# Patient Record
Sex: Female | Born: 1945 | Race: White | Hispanic: No | Smoking: Never smoker
Health system: Southern US, Community
[De-identification: ages and names within clinical notes are randomized; demographics above are authoritative.]

## PROBLEM LIST (undated history)

## (undated) DIAGNOSIS — I219 Acute myocardial infarction, unspecified: Secondary | ICD-10-CM

## (undated) DIAGNOSIS — I10 Essential (primary) hypertension: Secondary | ICD-10-CM

## (undated) DIAGNOSIS — E7800 Pure hypercholesterolemia, unspecified: Secondary | ICD-10-CM

## (undated) DIAGNOSIS — N289 Disorder of kidney and ureter, unspecified: Secondary | ICD-10-CM

## (undated) HISTORY — PX: CORONARY STENT INTERVENTION: CATH118234

## (undated) HISTORY — PX: HIP SURGERY: SHX245

## (undated) HISTORY — PX: PARATHYROIDECTOMY: SHX19

---

## 1999-08-26 ENCOUNTER — Other Ambulatory Visit: Admission: RE | Admit: 1999-08-26 | Discharge: 1999-08-26 | Payer: Self-pay | Admitting: Family Medicine

## 2020-03-13 ENCOUNTER — Encounter (HOSPITAL_BASED_OUTPATIENT_CLINIC_OR_DEPARTMENT_OTHER): Payer: Self-pay

## 2020-03-13 ENCOUNTER — Emergency Department (HOSPITAL_BASED_OUTPATIENT_CLINIC_OR_DEPARTMENT_OTHER): Payer: Medicare Other

## 2020-03-13 ENCOUNTER — Emergency Department (HOSPITAL_BASED_OUTPATIENT_CLINIC_OR_DEPARTMENT_OTHER)
Admission: EM | Admit: 2020-03-13 | Discharge: 2020-03-13 | Disposition: A | Discharge status: Home / Self Care | Payer: Medicare Other | Attending: Emergency Medicine | Admitting: Emergency Medicine

## 2020-03-13 ENCOUNTER — Other Ambulatory Visit: Payer: Self-pay

## 2020-03-13 DIAGNOSIS — M79661 Pain in right lower leg: Secondary | ICD-10-CM | POA: Diagnosis present

## 2020-03-13 DIAGNOSIS — I2510 Atherosclerotic heart disease of native coronary artery without angina pectoris: Secondary | ICD-10-CM | POA: Insufficient documentation

## 2020-03-13 DIAGNOSIS — S7001XA Contusion of right hip, initial encounter: Secondary | ICD-10-CM

## 2020-03-13 DIAGNOSIS — W010XXA Fall on same level from slipping, tripping and stumbling without subsequent striking against object, initial encounter: Secondary | ICD-10-CM | POA: Insufficient documentation

## 2020-03-13 DIAGNOSIS — S8391XA Sprain of unspecified site of right knee, initial encounter: Secondary | ICD-10-CM | POA: Diagnosis not present

## 2020-03-13 DIAGNOSIS — Y999 Unspecified external cause status: Secondary | ICD-10-CM | POA: Diagnosis not present

## 2020-03-13 DIAGNOSIS — Y9389 Activity, other specified: Secondary | ICD-10-CM | POA: Insufficient documentation

## 2020-03-13 DIAGNOSIS — Y9289 Other specified places as the place of occurrence of the external cause: Secondary | ICD-10-CM | POA: Insufficient documentation

## 2020-03-13 DIAGNOSIS — W19XXXA Unspecified fall, initial encounter: Secondary | ICD-10-CM

## 2020-03-13 HISTORY — DX: Pure hypercholesterolemia, unspecified: E78.00

## 2020-03-13 HISTORY — DX: Essential (primary) hypertension: I10

## 2020-03-13 HISTORY — DX: Disorder of kidney and ureter, unspecified: N28.9

## 2020-03-13 HISTORY — DX: Acute myocardial infarction, unspecified: I21.9

## 2020-03-13 MED ORDER — TRAMADOL HCL 50 MG PO TABS: 50.0000 mg | ORAL_TABLET | Freq: Four times a day (QID) | ORAL | 0 refills | Status: DC | PRN

## 2020-03-13 NOTE — Discharge Instructions (Addendum)
Rest.  Take tramadol as prescribed as needed for pain.  Follow-up with your primary doctor or orthopedist in the next week if symptoms are not improving.

## 2020-03-13 NOTE — ED Provider Notes (Signed)
[facility] EMERGENCY DEPARTMENT Provider Note   CSN: [id number] Arrival date & time: 03/13/20  1136     History Chief Complaint  Patient presents with  . Fall    Tamara Bowen is a 74 y.o. female.  Patient is a 74 year old female with past medical history of hypertension, chronic renal insufficiency, coronary artery disease, and prior right hip fracture.  She presents today for evaluation of fall.  Patient was walking in the hall yesterday and slipped on a wet floor.  She landed on her right hip.  She has been having pain in the hip and knee since.  It is worse when she twists her knee and ambulates.  She denies other injury.  She denies having struck her head or any chest, abdominal, or back pain.  The history is provided by the patient.  Fall This is a new problem. The current episode started yesterday. The problem occurs constantly. The problem has not changed since onset.Exacerbated by: Movement and bearing weight. The symptoms are relieved by rest. She has tried nothing for the symptoms.       Past Medical History:  Diagnosis Date  . Heart attack ([place])   . High cholesterol   . Hypertension   . Renal disorder     There are no problems to display for this patient.   Past Surgical History:  Procedure Laterality Date  . CORONARY STENT INTERVENTION    . HIP SURGERY    . PARATHYROIDECTOMY       OB History   No obstetric history on file.     No family history on file.  Social History   Tobacco Use  . Smoking status: Never Smoker  . Smokeless tobacco: Never Used  Substance Use Topics  . Alcohol use: Never  . Drug use: Never    Home Medications Prior to Admission medications   Not on File    Allergies    Patient has no known allergies.  Review of Systems   Review of Systems  All other systems reviewed and are negative.   Physical Exam Updated Vital Signs BP 132/66 (BP Location: Left Arm)   Pulse 74   Temp 98.9 F (37.2 C) (Oral)    Resp 18   SpO2 98%   Physical Exam Vitals and nursing note reviewed.  Constitutional:      General: She is not in acute distress.    Appearance: Normal appearance. She is not ill-appearing.  HENT:     Head: Normocephalic and atraumatic.  Pulmonary:     Effort: Pulmonary effort is normal.  Musculoskeletal:     Comments: The right hip appears grossly normal.  There is no obvious deformity.  She does have pain with range of motion.  The right knee appears grossly normal without deformity or effusion.  She does have some pain with range of motion, but the knee joint appears stable to AP and varus and valgus stress.  DP pulses are easily palpable and motor and sensation are intact throughout the entire foot.  Skin:    General: Skin is warm and dry.  Neurological:     Mental Status: She is alert and oriented to person, place, and time.     ED Results / Procedures / Treatments   Labs (all labs ordered are listed, but only abnormal results are displayed) Labs Reviewed - No data to display  EKG None  Radiology No results found.  Procedures Procedures (including critical care time)  Medications Ordered in  ED Medications - No data to display  ED Course  I have reviewed the triage vital signs and the nursing notes.  Pertinent labs & imaging results that were available during my care of the patient were reviewed by me and considered in my medical decision making (see chart for details).    MDM Rules/Calculators/A&P  Patient is a 74 year old female with prior right total hip replacement.  She presents complaining of pain in her right knee and hip after slipping on a wet floor and falling yesterday.  The right leg appears grossly normal and is neurovascularly intact.  The hip and knee joint appears stable and x-rays are negative for fracture/dislocation.  The screws in the right femoral neck appear in proper position.  At this point, I feel as though discharge is appropriate.   Patient to be given medicine for pain and is to follow-up with her orthopedist if not improving in the next week.  Final Clinical Impression(s) / ED Diagnoses Final diagnoses:  None    Rx / DC Orders ED Discharge Orders    None       Geoffery Lyons, MD 03/13/20 1345

## 2020-03-13 NOTE — ED Triage Notes (Addendum)
Pt states she slipped in water/ fell yesterday-pain to right LE and right hip-NAD-limping gait

## 2020-10-28 ENCOUNTER — Other Ambulatory Visit: Payer: Self-pay

## 2020-10-28 ENCOUNTER — Encounter (HOSPITAL_BASED_OUTPATIENT_CLINIC_OR_DEPARTMENT_OTHER): Payer: Self-pay | Admitting: Emergency Medicine

## 2020-10-28 DIAGNOSIS — R0789 Other chest pain: Secondary | ICD-10-CM | POA: Insufficient documentation

## 2020-10-28 DIAGNOSIS — I10 Essential (primary) hypertension: Secondary | ICD-10-CM | POA: Insufficient documentation

## 2020-10-28 DIAGNOSIS — Z955 Presence of coronary angioplasty implant and graft: Secondary | ICD-10-CM | POA: Diagnosis not present

## 2020-10-28 DIAGNOSIS — R531 Weakness: Secondary | ICD-10-CM | POA: Diagnosis present

## 2020-10-28 DIAGNOSIS — I2510 Atherosclerotic heart disease of native coronary artery without angina pectoris: Secondary | ICD-10-CM | POA: Insufficient documentation

## 2020-10-28 DIAGNOSIS — Z7982 Long term (current) use of aspirin: Secondary | ICD-10-CM | POA: Insufficient documentation

## 2020-10-28 DIAGNOSIS — Z79899 Other long term (current) drug therapy: Secondary | ICD-10-CM | POA: Diagnosis not present

## 2020-10-28 LAB — BASIC METABOLIC PANEL
Anion gap: 11 (ref 5–15)
BUN: 17 mg/dL (ref 8–23)
CO2: 21 mmol/L — ABNORMAL LOW (ref 22–32)
Calcium: 9.4 mg/dL (ref 8.9–10.3)
Chloride: 99 mmol/L (ref 98–111)
Creatinine, Ser: 1.06 mg/dL — ABNORMAL HIGH (ref 0.44–1.00)
GFR, Estimated: 55 mL/min — ABNORMAL LOW (ref >60)
Glucose, Bld: 104 mg/dL — ABNORMAL HIGH (ref 70–99)
Potassium: 3.8 mmol/L (ref 3.5–5.1)
Sodium: 131 mmol/L — ABNORMAL LOW (ref 135–145)

## 2020-10-28 LAB — CBC
HCT: 37.5 % (ref 36.0–46.0)
Hemoglobin: 12.8 g/dL (ref 12.0–15.0)
MCH: 30.5 pg (ref 26.0–34.0)
MCHC: 34.1 g/dL (ref 30.0–36.0)
MCV: 89.3 fL (ref 80.0–100.0)
Platelets: 214 10*3/uL (ref 150–400)
RBC: 4.2 MIL/uL (ref 3.87–5.11)
RDW: 14.4 % (ref 11.5–15.5)
WBC: 9.6 10*3/uL (ref 4.0–10.5)
nRBC: 0 % (ref 0.0–0.2)

## 2020-10-28 LAB — TROPONIN I (HIGH SENSITIVITY): Troponin I (High Sensitivity): 9 ng/L (ref <18)

## 2020-10-28 NOTE — ED Triage Notes (Signed)
Pt having weakness in both arms and legs since this am.  Pt states she had a burning sensation in her chest.  Went to [facility] via ambulance and had EKG, CXR performed.  Her granddaughter took her from there to here.  Currently pt denies any chest burning but feels like her arms are weak/tingling.  No neuro deficits noted.  Grips equal, no numbness in extremeties.  Pt admits to GERD

## 2020-10-29 ENCOUNTER — Emergency Department (HOSPITAL_BASED_OUTPATIENT_CLINIC_OR_DEPARTMENT_OTHER)
Admission: EM | Admit: 2020-10-29 | Discharge: 2020-10-29 | Disposition: A | Discharge status: Home / Self Care | Payer: Medicare Other | Attending: Emergency Medicine | Admitting: Emergency Medicine

## 2020-10-29 DIAGNOSIS — R0789 Other chest pain: Secondary | ICD-10-CM

## 2020-10-29 DIAGNOSIS — R531 Weakness: Secondary | ICD-10-CM | POA: Diagnosis not present

## 2020-10-29 LAB — TROPONIN I (HIGH SENSITIVITY): Troponin I (High Sensitivity): 11 ng/L (ref <18)

## 2020-10-29 NOTE — ED Notes (Signed)
Patient verbalizes understanding of discharge instructions. Opportunity for questioning and answers were provided. Armband removed by staff, pt discharged from ED ambulatory to home.  

## 2020-10-29 NOTE — ED Provider Notes (Signed)
[facility] EMERGENCY DEPARTMENT Provider Note   CSN: [id number] Arrival date & time: 10/28/20  1643     History Chief Complaint  Patient presents with  . Weakness    Tamara Bowen is a 75 y.o. female.  Patient is a 75 year old female with history of coronary artery disease with prior stents, hypertension, hyperlipidemia.  Patient presents today for evaluation of burning in the center of her chest along with numbness in her arms and legs.  This came on this morning while eating breakfast.  Patient initially went to [facility] by ambulance.  She was taken to the waiting room, then brought here by family for evaluation.  Patient tells me she took 3 nitroglycerin at home with some relief, however her symptoms returned.  She denies shortness of breath, nausea, diaphoresis.  She denies recent exertional symptoms.  She sees a cardiologist here at [facility] who placed the stents approximately 5 years ago.  The history is provided by the patient.  Weakness Severity:  Moderate Onset quality:  Sudden Duration:  12 hours Timing:  Constant Progression:  Unchanged Chronicity:  New Relieved by:  Nothing Worsened by:  Nothing Ineffective treatments:  None tried Associated symptoms: chest pain        Past Medical History:  Diagnosis Date  . Heart attack ([facility])   . High cholesterol   . Hypertension   . Renal disorder     There are no problems to display for this patient.   Past Surgical History:  Procedure Laterality Date  . CORONARY STENT INTERVENTION    . HIP SURGERY    . PARATHYROIDECTOMY       OB History   No obstetric history on file.     No family history on file.  Social History   Tobacco Use  . Smoking status: Never Smoker  . Smokeless tobacco: Never Used  Vaping Use  . Vaping Use: Never used  Substance Use Topics  . Alcohol use: Never  . Drug use: Never    Home Medications Prior to Admission medications   Medication Sig Start Date  End Date Taking? Authorizing Provider  amLODipine (NORVASC) 5 MG tablet Take 5 mg by mouth daily. 02/29/20   [provider]  aspirin 81 MG EC tablet Take by mouth.    [provider]  atorvastatin (LIPITOR) 40 MG tablet Take 40 mg by mouth daily. 01/16/20   [provider]  carvedilol (COREG) 25 MG tablet Take 25 mg by mouth 2 (two) times daily. 01/26/20   [provider]  Cholecalciferol 25 MCG (1000 UT) capsule Take by mouth.    [provider]  clopidogrel (PLAVIX) 75 MG tablet Take 75 mg by mouth daily. 01/26/20   [provider]  famotidine (PEPCID) 20 MG tablet TAKE 2 TABLETS BY MOUTH EVERY EVENING 02/22/20   [provider]  hydrALAZINE (APRESOLINE) 25 MG tablet Take 25 mg by mouth 3 (three) times daily. 12/21/19   [provider]  isosorbide mononitrate (IMDUR) 120 MG 24 hr tablet Take 120 mg by mouth daily. 01/26/20   [provider]  levothyroxine (SYNTHROID) 88 MCG tablet Take 88 mcg by mouth daily. 12/21/19   [provider]  traMADol (ULTRAM) 50 MG tablet Take 1 tablet (50 mg total) by mouth every 6 (six) hours as needed. 03/13/20   Geoffery Lyons, MD    Allergies    Patient has no known allergies.  Review of Systems   Review of Systems  Cardiovascular: Positive for chest pain.  Neurological: Positive for weakness.  All other systems reviewed and are negative.   Physical Exam Updated Vital Signs BP (!) 175/73 (BP Location: Right Arm)   Pulse 73   Temp 98.6 F (37 C) (Oral)   Resp 19   Ht 5\' 2"  (1.575 m)   Wt 55.8 kg   SpO2 97%   BMI 22.50 kg/m   Physical Exam Vitals and nursing note reviewed.  Constitutional:      General: She is not in acute distress.    Appearance: She is well-developed and well-nourished. She is not diaphoretic.  HENT:     Head: Normocephalic and atraumatic.  Cardiovascular:     Rate and Rhythm: Normal rate and regular rhythm.     Heart sounds: No murmur  heard. No friction rub. No gallop.   Pulmonary:     Effort: Pulmonary effort is normal. No respiratory distress.     Breath sounds: Normal breath sounds. No wheezing.  Abdominal:     General: Bowel sounds are normal. There is no distension.     Palpations: Abdomen is soft.     Tenderness: There is no abdominal tenderness.  Musculoskeletal:        General: No swelling or tenderness. Normal range of motion.     Cervical back: Normal range of motion and neck supple.     Right lower leg: No edema.     Left lower leg: No edema.  Skin:    General: Skin is warm and dry.  Neurological:     Mental Status: She is alert and oriented to person, place, and time.     ED Results / Procedures / Treatments   Labs (all labs ordered are listed, but only abnormal results are displayed) Labs Reviewed  BASIC METABOLIC PANEL - Abnormal; Notable for the following components:      Result Value   Sodium 131 (*)    CO2 21 (*)    Glucose, Bld 104 (*)    Creatinine, Ser 1.06 (*)    GFR, Estimated 55 (*)    All other components within normal limits  CBC  TROPONIN I (HIGH SENSITIVITY)  TROPONIN I (HIGH SENSITIVITY)    EKG EKG Interpretation  Date/Time:  Monday October 28 2020 17:52:21 EST Ventricular Rate:  80 PR Interval:  208 QRS Duration: 72 QT Interval:  396 QTC Calculation: 456 R Axis:   63 Text Interpretation: Normal sinus rhythm Nonspecific ST and T wave abnormality Abnormal ECG Confirmed by [id number] (Geoffery Lyons) on 10/29/2020 1:12:52 AM   Radiology No results found.  Procedures Procedures   Medications Ordered in ED Medications - No data to display  ED Course  I have reviewed the triage vital signs and the nursing notes.  Pertinent labs & imaging results that were available during my care of the patient were reviewed by me and considered in my medical decision making (see chart for details).    MDM Rules/Calculators/A&P  Patient presenting here with complaints of weakness  in her arms and a burning sensation in her chest.  She has history of coronary artery disease, but this is not consistent with prior cardiac symptoms.  Patient's work-up shows an unchanged EKG and negative troponin x2.  She tells me she is feeling better.  At this point, I feel as though discharge is appropriate.  I will advise her to follow-up with her cardiologist later this week, and return to the ER if her symptoms significantly worsen or change.  Final Clinical Impression(s) / ED Diagnoses Final diagnoses:  None    Rx / DC Orders ED Discharge Orders    None       Geoffery Lyons, MD 10/29/20 [phone]

## 2020-10-29 NOTE — Discharge Instructions (Addendum)
Continue medications as previously prescribed.  Follow-up with your cardiologist later this week if symptoms do not improve, and return to the ER if you develop severe chest pain, difficulty breathing, or other new and concerning symptoms.

## 2021-12-01 IMAGING — CR DG FEMUR 2+V*R*
4 series · 4 of 4 positions shown · non-contrast
Comparison: Pelvis and right hip radiographs March 13, 2020

CLINICAL DATA: Pain following fall

EXAM:
RIGHT FEMUR 2 VIEWS

[t hip ap right]
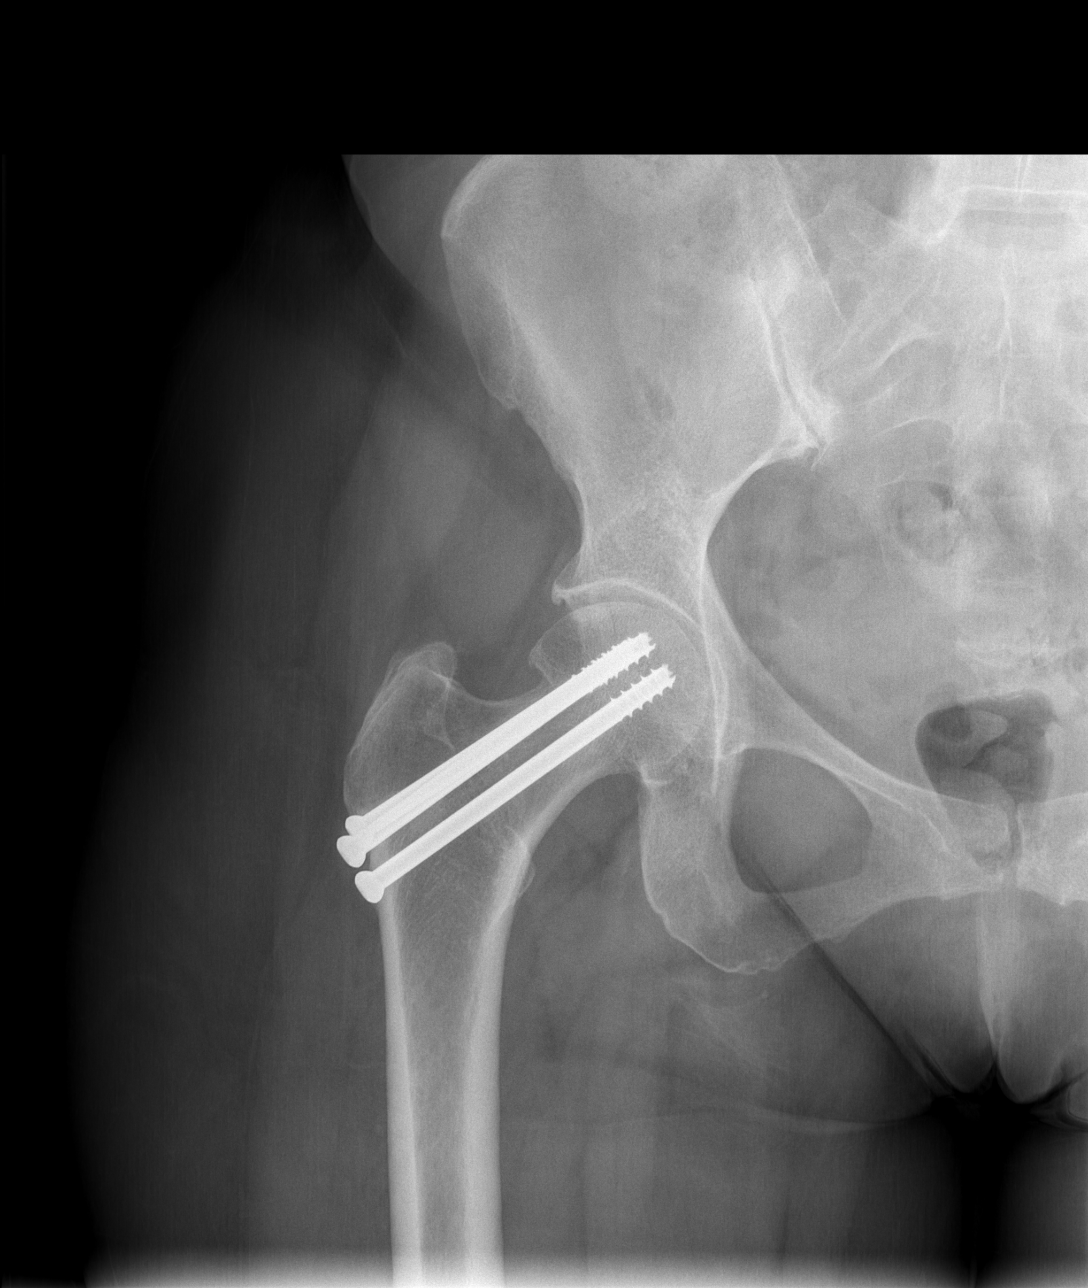

[t femur with knee ap right]
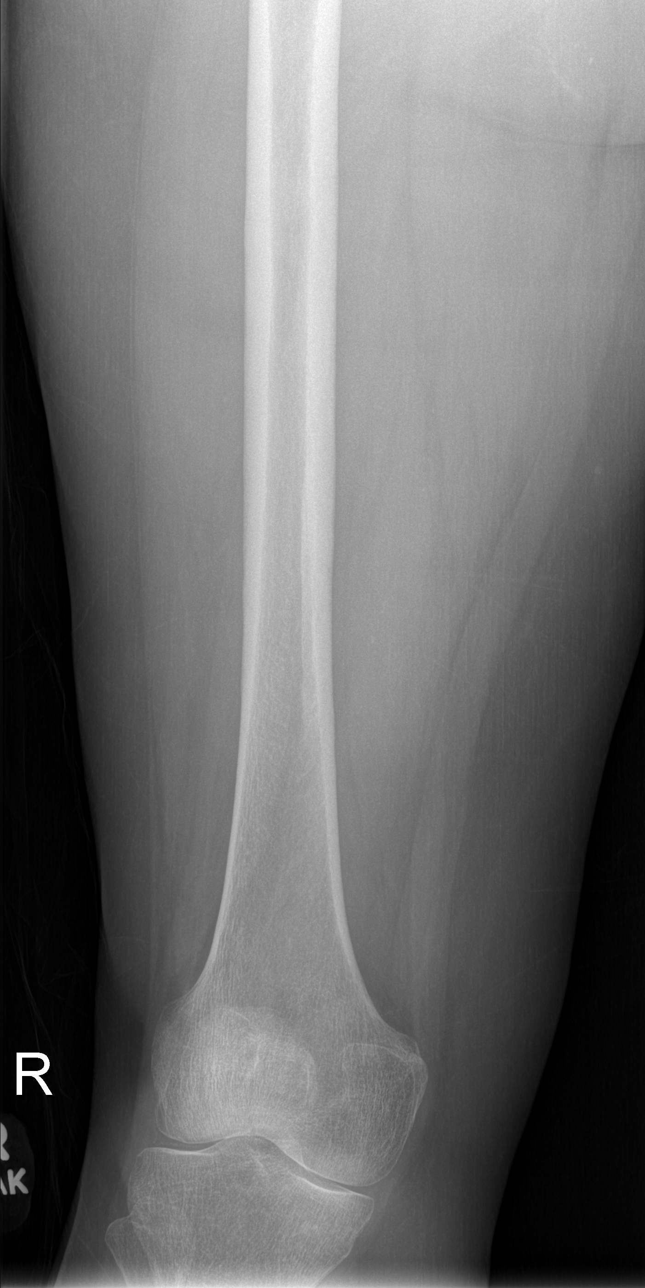

[t hip frog leg right]
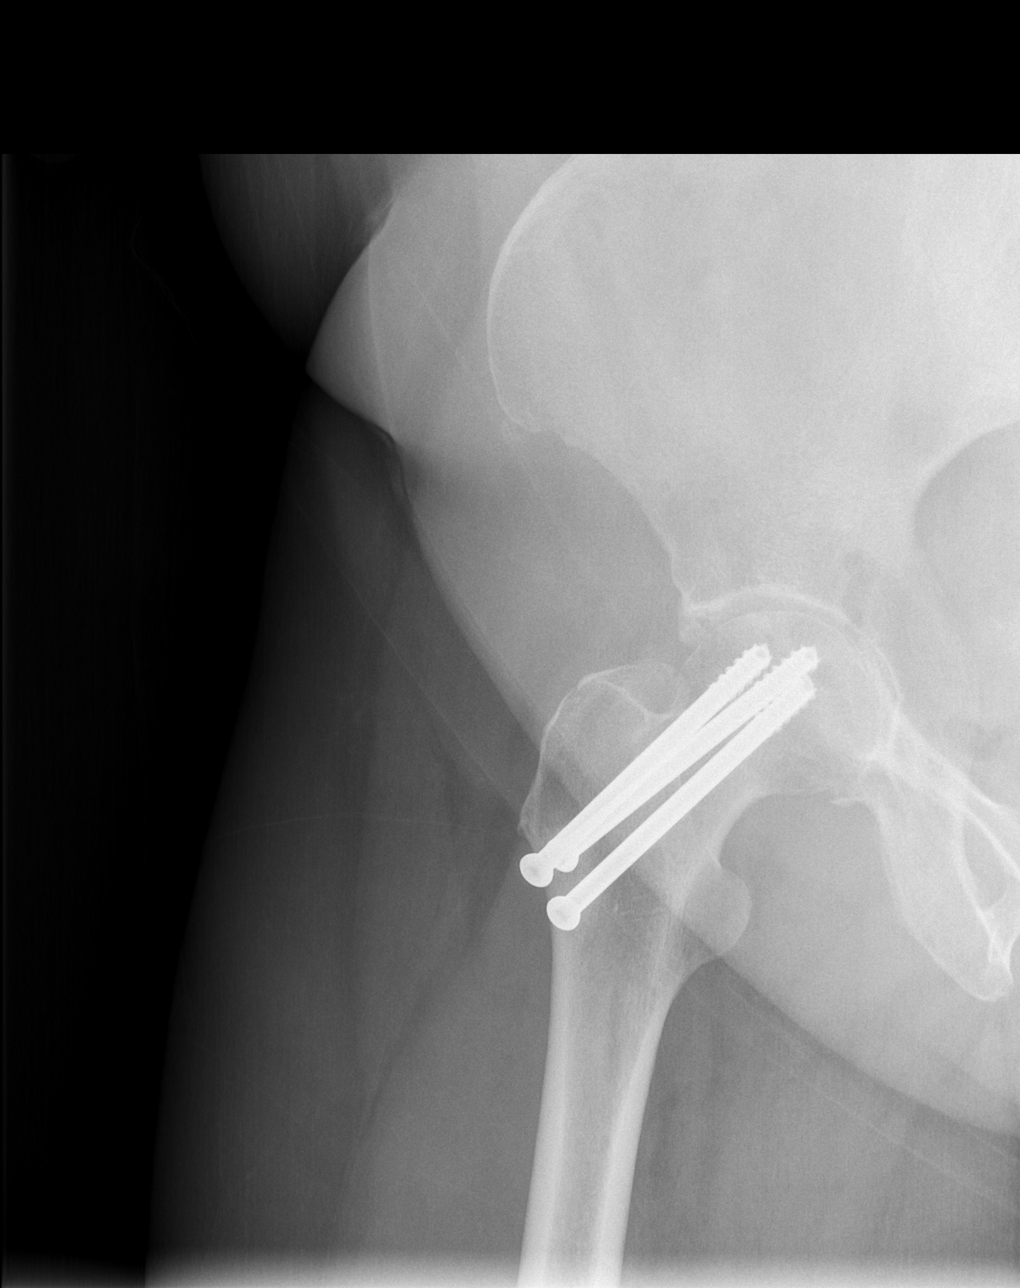

[t femur with hip lat right]
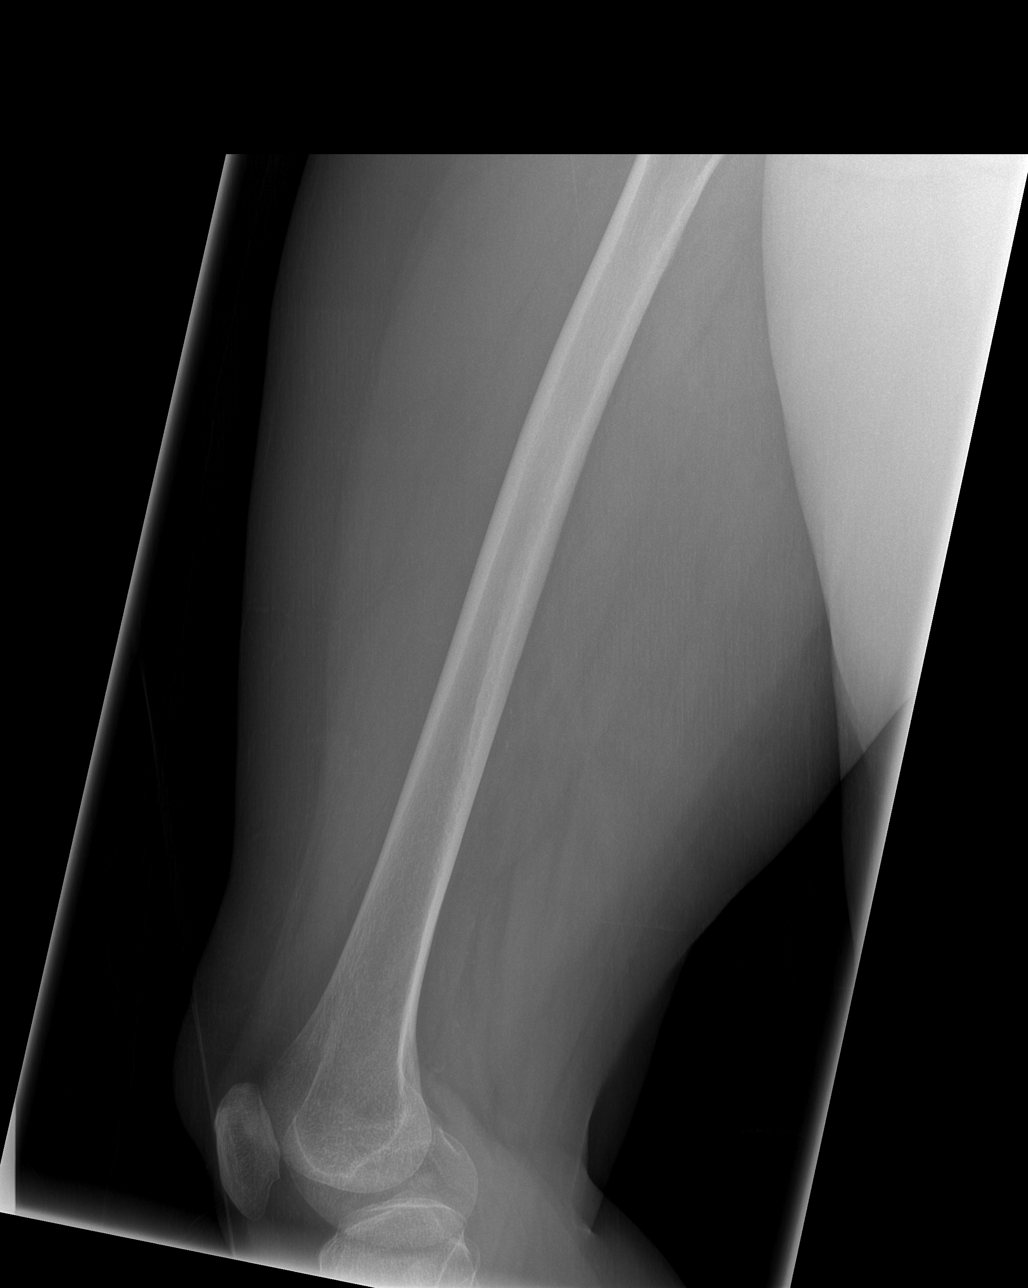

[4 of 4 positions shown; findings below may reference images not displayed]

FINDINGS: Frontal and lateral views obtained. Surgical screws through the
femoral neck region on the right are present with screw tips in the
right femoral head. Alignment in this area is anatomic. No acute
fracture or dislocation. No abnormal periosteal reaction. There is
moderate narrowing of the right hip joint. There is mild narrowing
of the medial and lateral compartments of the right knee were slight
chondrocalcinosis in the right knee region.
IMPRESSION: Postoperative change proximally. No acute fracture or dislocation.
Osteoarthritic change in the right hip and knee regions.
Chondrocalcinosis in the right knee joint may be seen with
osteoarthritis or with calcium pyrophosphate deposition disease.

## 2021-12-01 IMAGING — CR DG HIP (WITH OR WITHOUT PELVIS) 2-3V*R*
3 series · 3 of 3 positions shown · non-contrast
Comparison: None.

CLINICAL DATA: Pain following

EXAM:
DG HIP (WITH OR WITHOUT PELVIS) 2-3V RIGHT

[t pelvis a.p.]
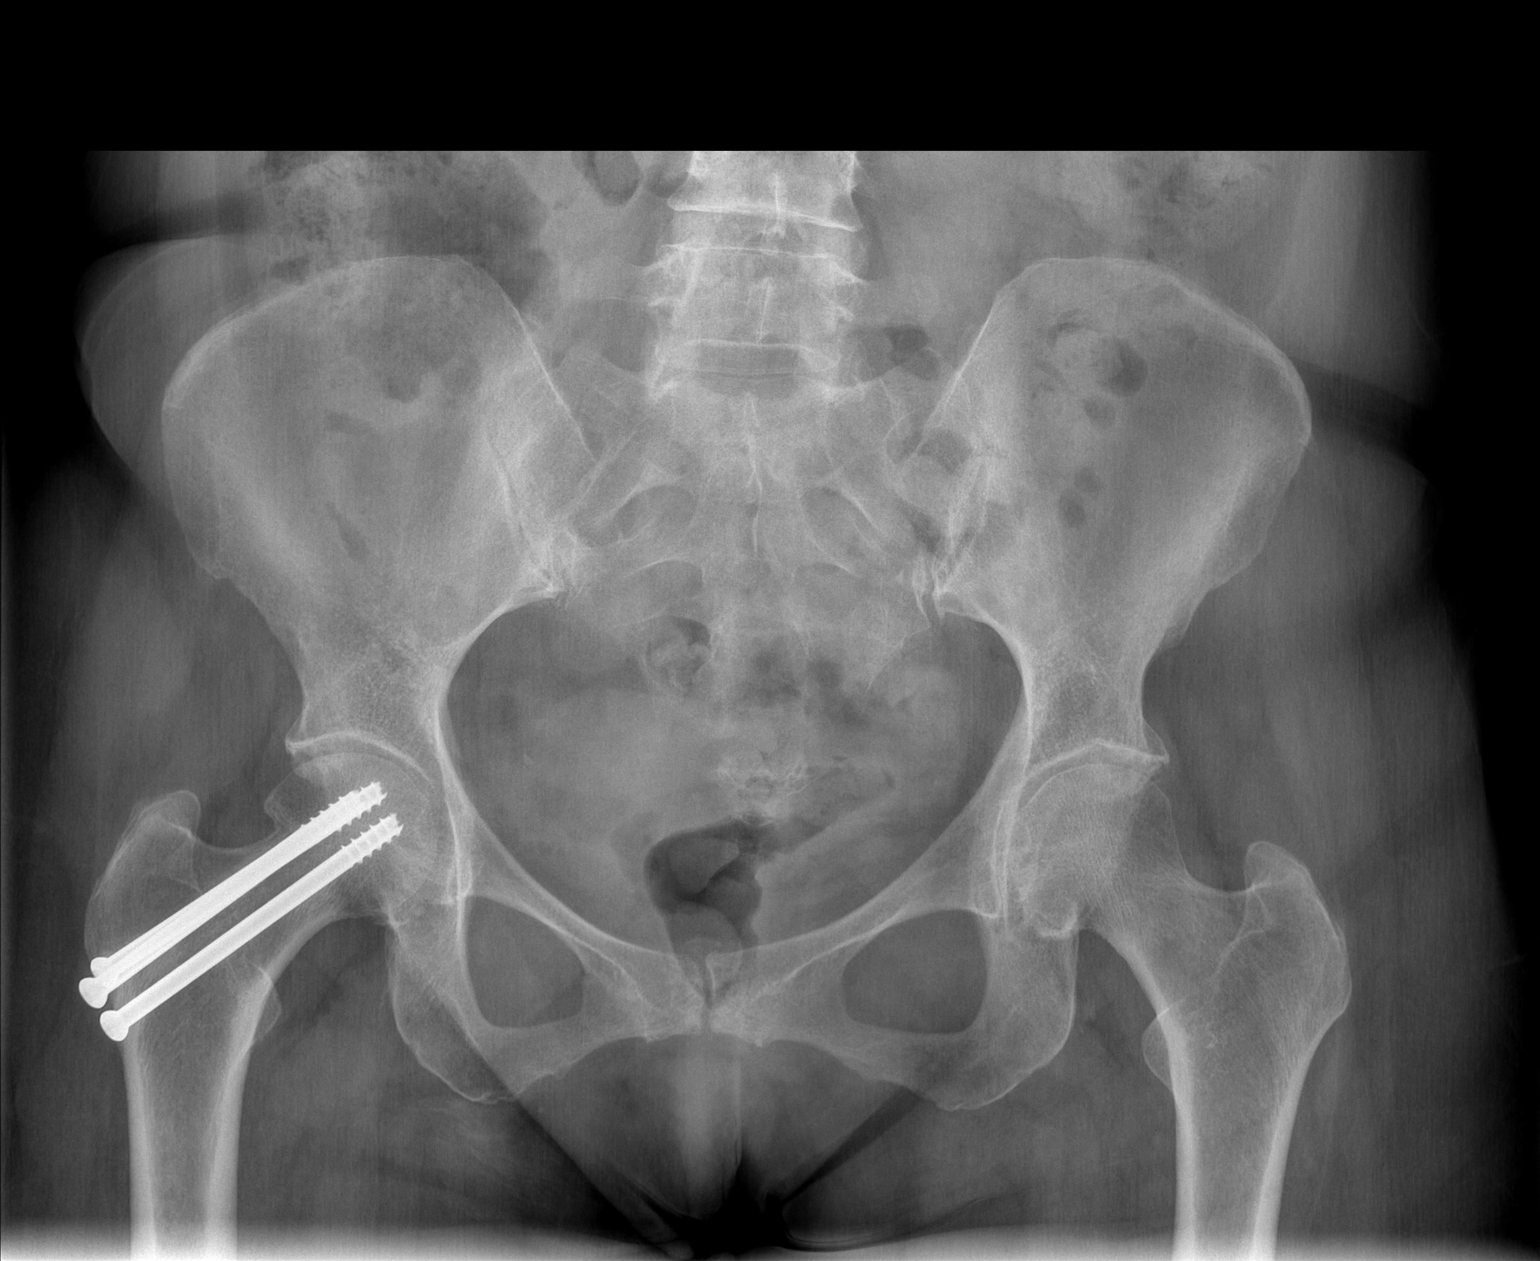

[t hip ap right]
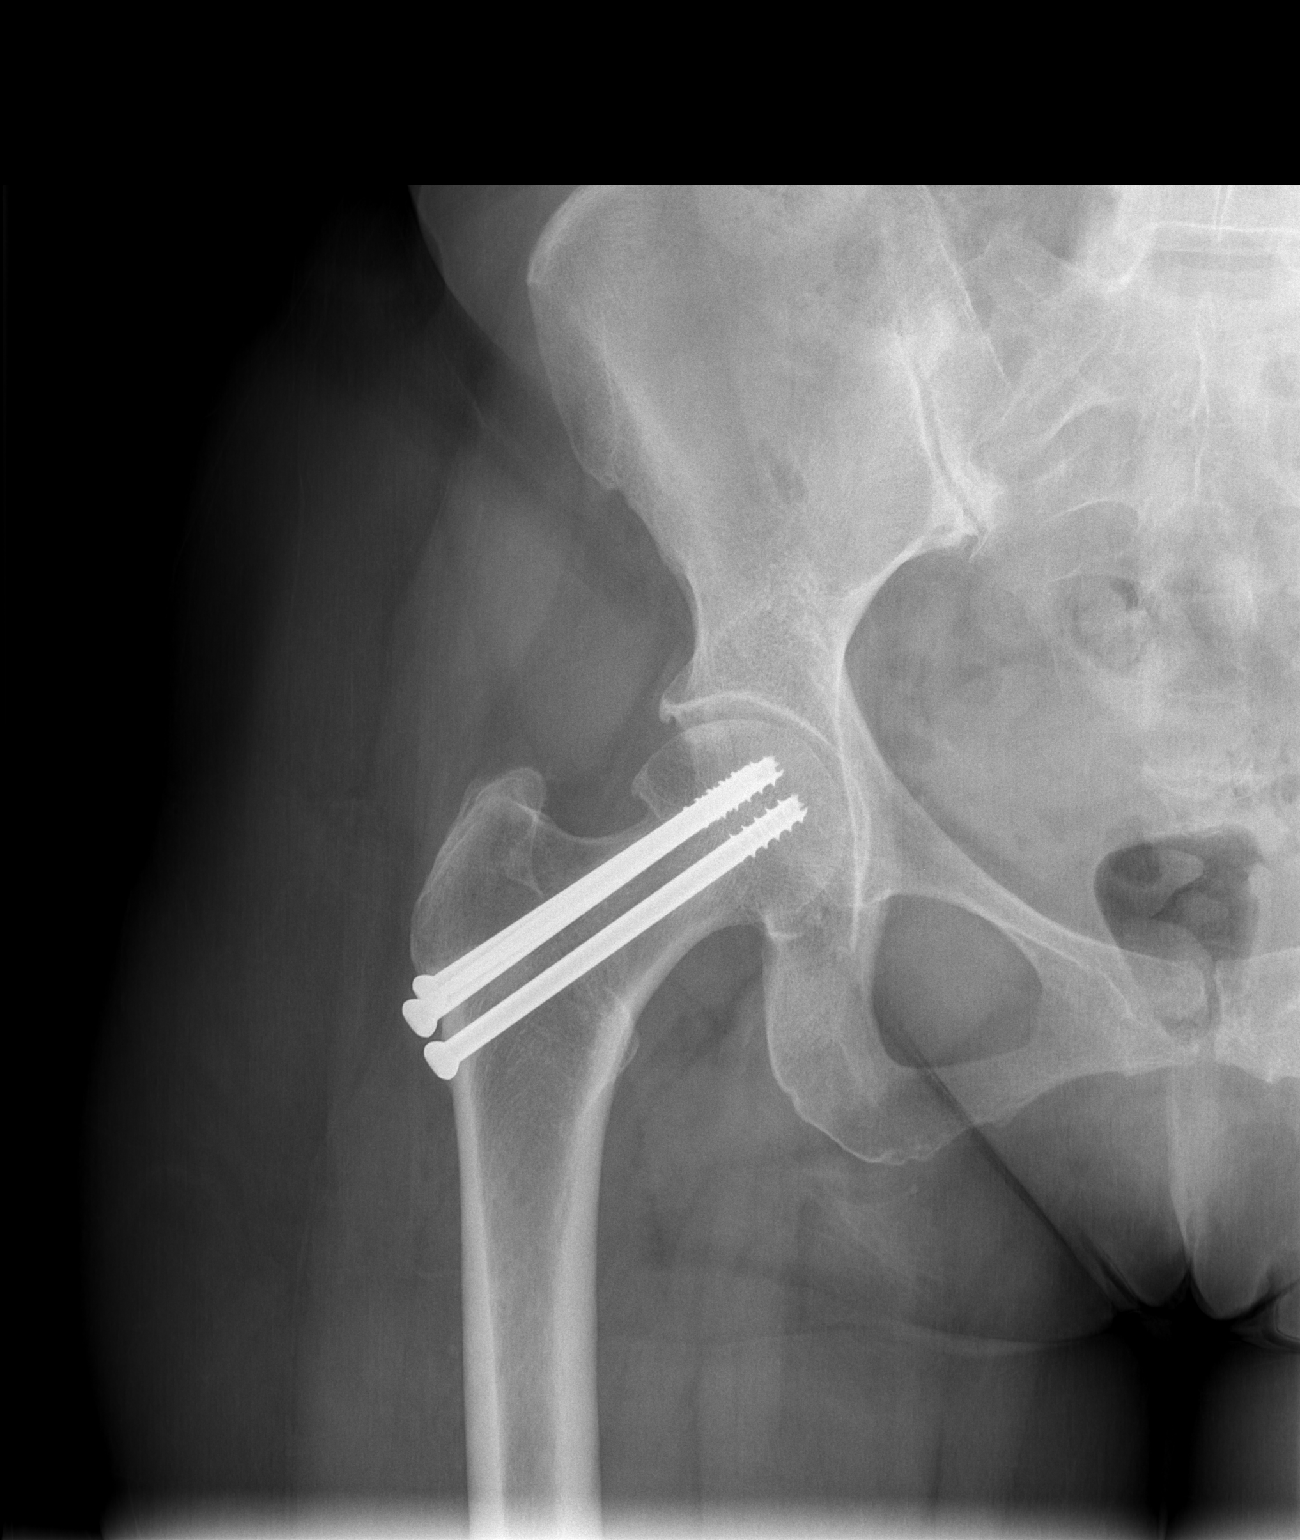

[t hip frog leg right]
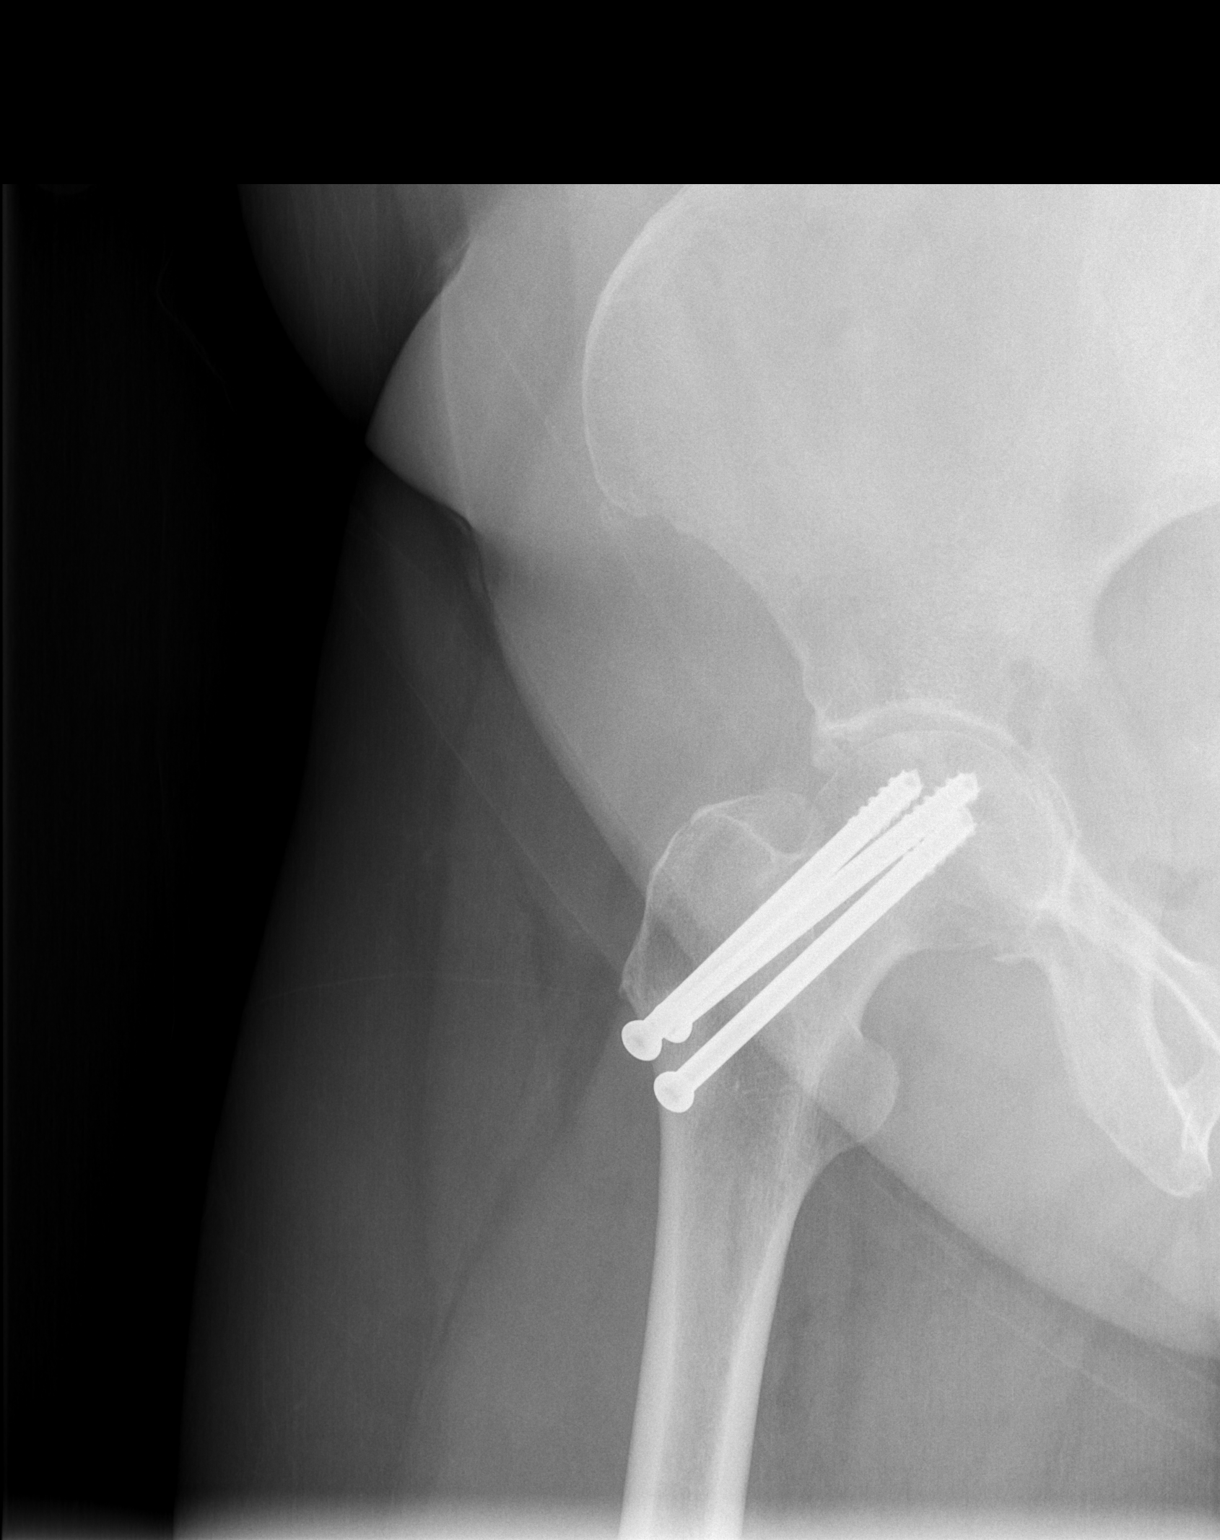

[3 of 3 positions shown; findings below may reference images not displayed]

FINDINGS: Pelvis as well as frontal and lateral right hip images obtained.
There are 3 screws in the proximal right femur region screw tips in
the right femoral head. No fracture or dislocation. There is
moderate symmetric narrowing of each hip joint. No erosive change.
There is mild degenerative change in the pubic symphysis.
IMPRESSION: Postoperative change proximal right femur with alignment in this
area is anatomic. No acute fracture or dislocation. Moderate
symmetric narrowing each hip joint.

## 2023-06-13 ENCOUNTER — Other Ambulatory Visit: Payer: Self-pay

## 2023-06-13 ENCOUNTER — Inpatient Hospital Stay (HOSPITAL_BASED_OUTPATIENT_CLINIC_OR_DEPARTMENT_OTHER)
Admission: EM | Admit: 2023-06-13 | Discharge: 2023-06-19 | DRG: 077 | Disposition: A | Discharge status: Home / Self Care | Payer: Medicare Other | Attending: Family Medicine | Admitting: Family Medicine

## 2023-06-13 ENCOUNTER — Emergency Department (HOSPITAL_BASED_OUTPATIENT_CLINIC_OR_DEPARTMENT_OTHER): Payer: Medicare Other

## 2023-06-13 DIAGNOSIS — R519 Headache, unspecified: Secondary | ICD-10-CM

## 2023-06-13 DIAGNOSIS — Z7982 Long term (current) use of aspirin: Secondary | ICD-10-CM

## 2023-06-13 DIAGNOSIS — I161 Hypertensive emergency: Secondary | ICD-10-CM | POA: Diagnosis present

## 2023-06-13 DIAGNOSIS — R410 Disorientation, unspecified: Principal | ICD-10-CM

## 2023-06-13 DIAGNOSIS — Z91148 Patient's other noncompliance with medication regimen for other reason: Secondary | ICD-10-CM

## 2023-06-13 DIAGNOSIS — I951 Orthostatic hypotension: Secondary | ICD-10-CM | POA: Diagnosis present

## 2023-06-13 DIAGNOSIS — I674 Hypertensive encephalopathy: Principal | ICD-10-CM | POA: Diagnosis present

## 2023-06-13 DIAGNOSIS — N1831 Chronic kidney disease, stage 3a: Secondary | ICD-10-CM | POA: Diagnosis present

## 2023-06-13 DIAGNOSIS — Z79899 Other long term (current) drug therapy: Secondary | ICD-10-CM

## 2023-06-13 DIAGNOSIS — I129 Hypertensive chronic kidney disease with stage 1 through stage 4 chronic kidney disease, or unspecified chronic kidney disease: Secondary | ICD-10-CM | POA: Diagnosis present

## 2023-06-13 DIAGNOSIS — E039 Hypothyroidism, unspecified: Secondary | ICD-10-CM | POA: Diagnosis present

## 2023-06-13 DIAGNOSIS — N179 Acute kidney failure, unspecified: Secondary | ICD-10-CM | POA: Diagnosis present

## 2023-06-13 DIAGNOSIS — R4182 Altered mental status, unspecified: Secondary | ICD-10-CM | POA: Diagnosis not present

## 2023-06-13 DIAGNOSIS — E860 Dehydration: Secondary | ICD-10-CM | POA: Diagnosis not present

## 2023-06-13 DIAGNOSIS — E7800 Pure hypercholesterolemia, unspecified: Secondary | ICD-10-CM | POA: Diagnosis present

## 2023-06-13 DIAGNOSIS — Z7902 Long term (current) use of antithrombotics/antiplatelets: Secondary | ICD-10-CM

## 2023-06-13 DIAGNOSIS — Z79890 Hormone replacement therapy (postmenopausal): Secondary | ICD-10-CM

## 2023-06-13 DIAGNOSIS — F0390 Unspecified dementia without behavioral disturbance: Secondary | ICD-10-CM | POA: Diagnosis present

## 2023-06-13 DIAGNOSIS — F05 Delirium due to known physiological condition: Secondary | ICD-10-CM | POA: Diagnosis present

## 2023-06-13 DIAGNOSIS — Z955 Presence of coronary angioplasty implant and graft: Secondary | ICD-10-CM

## 2023-06-13 DIAGNOSIS — I252 Old myocardial infarction: Secondary | ICD-10-CM

## 2023-06-13 DIAGNOSIS — G9341 Metabolic encephalopathy: Secondary | ICD-10-CM | POA: Insufficient documentation

## 2023-06-13 DIAGNOSIS — I2510 Atherosclerotic heart disease of native coronary artery without angina pectoris: Secondary | ICD-10-CM | POA: Diagnosis present

## 2023-06-13 LAB — CBG MONITORING, ED: Glucose-Capillary: 131 mg/dL — ABNORMAL HIGH (ref 70–99)

## 2023-06-13 LAB — URINALYSIS, ROUTINE W REFLEX MICROSCOPIC
Bilirubin Urine: NEGATIVE
Glucose, UA: NEGATIVE mg/dL
Hgb urine dipstick: NEGATIVE
Ketones, ur: NEGATIVE mg/dL
Leukocytes,Ua: NEGATIVE
Nitrite: NEGATIVE
Protein, ur: 100 mg/dL — AB
Specific Gravity, Urine: 1.02 (ref 1.005–1.030)
pH: 7.5 (ref 5.0–8.0)

## 2023-06-13 LAB — COMPREHENSIVE METABOLIC PANEL
ALT: 17 U/L (ref 0–44)
AST: 19 U/L (ref 15–41)
Albumin: 4.4 g/dL (ref 3.5–5.0)
Alkaline Phosphatase: 50 U/L (ref 38–126)
Anion gap: 12 (ref 5–15)
BUN: 20 mg/dL (ref 8–23)
CO2: 22 mmol/L (ref 22–32)
Calcium: 10.8 mg/dL — ABNORMAL HIGH (ref 8.9–10.3)
Chloride: 103 mmol/L (ref 98–111)
Creatinine, Ser: 1.15 mg/dL — ABNORMAL HIGH (ref 0.44–1.00)
GFR, Estimated: 49 mL/min — ABNORMAL LOW (ref >60)
Glucose, Bld: 134 mg/dL — ABNORMAL HIGH (ref 70–99)
Potassium: 3.9 mmol/L (ref 3.5–5.1)
Sodium: 137 mmol/L (ref 135–145)
Total Bilirubin: 0.7 mg/dL (ref 0.3–1.2)
Total Protein: 7 g/dL (ref 6.5–8.1)

## 2023-06-13 LAB — CBC WITH DIFFERENTIAL/PLATELET
Abs Immature Granulocytes: 0.04 10*3/uL (ref 0.00–0.07)
Basophils Absolute: 0.1 10*3/uL (ref 0.0–0.1)
Basophils Relative: 1 %
Eosinophils Absolute: 0.1 10*3/uL (ref 0.0–0.5)
Eosinophils Relative: 1 %
HCT: 38 % (ref 36.0–46.0)
Hemoglobin: 13.1 g/dL (ref 12.0–15.0)
Immature Granulocytes: 0 %
Lymphocytes Relative: 16 %
Lymphs Abs: 1.7 10*3/uL (ref 0.7–4.0)
MCH: 31.5 pg (ref 26.0–34.0)
MCHC: 34.5 g/dL (ref 30.0–36.0)
MCV: 91.3 fL (ref 80.0–100.0)
Monocytes Absolute: 0.9 10*3/uL (ref 0.1–1.0)
Monocytes Relative: 9 %
Neutro Abs: 7.5 10*3/uL (ref 1.7–7.7)
Neutrophils Relative %: 73 %
Platelets: 231 10*3/uL (ref 150–400)
RBC: 4.16 MIL/uL (ref 3.87–5.11)
RDW: 14.5 % (ref 11.5–15.5)
WBC: 10.2 10*3/uL (ref 4.0–10.5)
nRBC: 0 % (ref 0.0–0.2)

## 2023-06-13 LAB — PROTIME-INR
INR: 1 (ref 0.8–1.2)
Prothrombin Time: 12.9 s (ref 11.4–15.2)

## 2023-06-13 LAB — URINALYSIS, MICROSCOPIC (REFLEX)

## 2023-06-13 LAB — BRAIN NATRIURETIC PEPTIDE: B Natriuretic Peptide: 28.3 pg/mL (ref 0.0–100.0)

## 2023-06-13 MED ORDER — FENTANYL CITRATE PF 50 MCG/ML IJ SOSY
50.0000 ug | PREFILLED_SYRINGE | Freq: Once | INTRAMUSCULAR | Status: AC
  Administered 2023-06-13: 50 ug via INTRAVENOUS
  Filled 2023-06-13: qty 1

## 2023-06-13 MED ORDER — ACETAMINOPHEN 500 MG PO TABS
1000.0000 mg | ORAL_TABLET | Freq: Once | ORAL | Status: AC
  Administered 2023-06-13: 1000 mg via ORAL
  Filled 2023-06-13: qty 2

## 2023-06-13 MED ORDER — METOCLOPRAMIDE HCL 5 MG/ML IJ SOLN
10.0000 mg | Freq: Once | INTRAMUSCULAR | Status: AC
  Administered 2023-06-13: 10 mg via INTRAVENOUS
  Filled 2023-06-13: qty 2

## 2023-06-13 MED ORDER — NICARDIPINE HCL IN NACL 20-0.86 MG/200ML-% IV SOLN
3.0000 mg/h | INTRAVENOUS | Status: DC
  Administered 2023-06-13: 5 mg/h via INTRAVENOUS
  Filled 2023-06-13: qty 200

## 2023-06-13 NOTE — ED Provider Notes (Signed)
[place] [facility] AT [facility] Provider Note   CSN: [id number] Arrival date & time: 06/13/23  2056     History  Chief Complaint  Patient presents with   Altered Mental Status    Tamara Bowen is a 77 y.o. female.  Patient is a 77 year old female with a past medical history of hypertension, hyperlipidemia, CAD presenting to the emergency department with headache and altered mental status.  Patient is here with her daughter who reports that she has been complaining of a headache for the last several days.  She states that she was seen by her primary doctor 2 days ago and her headache was thought to be due to her high blood pressure.  She states that her blood pressure medications were increased at that time and she was started on a headache medication.  She states that the headache has been getting worse every day and today she started to seem more confused.  He reports that she is taking her shoes on and off multiple times and seemed to not know what she was supposed to be doing which is abnormal for her.  Patient is on aspirin but they deny any other known blood thinner use.  Patient reports associated dizziness, no reported vomiting or fevers by family.  They state that she has been able to ambulate steadily though more slowly than usual.  The history is provided by the patient and a relative. The history is limited by the condition of the patient.  Altered Mental Status      Home Medications Prior to Admission medications   Medication Sig Start Date End Date Taking? Authorizing Provider  amLODipine (NORVASC) 5 MG tablet Take 5 mg by mouth daily. 02/29/20   [provider]  aspirin 81 MG EC tablet Take by mouth.    [provider]  atorvastatin (LIPITOR) 40 MG tablet Take 40 mg by mouth daily. 01/16/20   [provider]  carvedilol (COREG) 25 MG tablet Take 25 mg by mouth 2 (two) times daily. 01/26/20   [provider]   Cholecalciferol 25 MCG (1000 UT) capsule Take by mouth.    [provider]  clopidogrel (PLAVIX) 75 MG tablet Take 75 mg by mouth daily. 01/26/20   [provider]  famotidine (PEPCID) 20 MG tablet TAKE 2 TABLETS BY MOUTH EVERY EVENING 02/22/20   [provider]  hydrALAZINE (APRESOLINE) 25 MG tablet Take 25 mg by mouth 3 (three) times daily. 12/21/19   [provider]  isosorbide mononitrate (IMDUR) 120 MG 24 hr tablet Take 120 mg by mouth daily. 01/26/20   [provider]  levothyroxine (SYNTHROID) 88 MCG tablet Take 88 mcg by mouth daily. 12/21/19   [provider]  traMADol (ULTRAM) 50 MG tablet Take 1 tablet (50 mg total) by mouth every 6 (six) hours as needed. 03/13/20   Geoffery Lyons, MD      Allergies    Patient has no known allergies.    Review of Systems   Review of Systems  Physical Exam Updated Vital Signs BP (!) 228/70   Pulse 70   Temp 97.7 F (36.5 C) (Oral)   Resp (!) 21   SpO2 99%  Physical Exam Vitals and nursing note reviewed.  Constitutional:      General: She is in acute distress.     Appearance: She is ill-appearing. She is not toxic-appearing or diaphoretic.     Comments: Holding head, complaining of pain  HENT:  Head: Normocephalic and atraumatic.     Nose: Nose normal.     Mouth/Throat:     Mouth: Mucous membranes are moist.     Pharynx: Oropharynx is clear.  Eyes:     Extraocular Movements: Extraocular movements intact.     Conjunctiva/sclera: Conjunctivae normal.     Pupils: Pupils are equal, round, and reactive to light.  Cardiovascular:     Rate and Rhythm: Normal rate and regular rhythm.     Heart sounds: Normal heart sounds.  Pulmonary:     Effort: Pulmonary effort is normal.     Breath sounds: Normal breath sounds.  Abdominal:     General: Abdomen is flat.     Palpations: Abdomen is soft.     Tenderness: There is no abdominal tenderness.  Musculoskeletal:        General: Normal range  of motion.     Cervical back: Normal range of motion and neck supple. No rigidity.  Skin:    General: Skin is warm and dry.  Neurological:     Mental Status: She is alert.     Cranial Nerves: No cranial nerve deficit.     Sensory: No sensory deficit.     Motor: No weakness.     Coordination: Coordination normal.     Comments: Oriented to person only Somewhat difficulty following commands such as finger to nose, answering some questions inappropriately      ED Results / Procedures / Treatments   Labs (all labs ordered are listed, but only abnormal results are displayed) Labs Reviewed  CBG MONITORING, ED - Abnormal; Notable for the following components:      Result Value   Glucose-Capillary 131 (*)    All other components within normal limits  BRAIN NATRIURETIC PEPTIDE  CBC WITH DIFFERENTIAL/PLATELET  PROTIME-INR  URINALYSIS, ROUTINE W REFLEX MICROSCOPIC  COMPREHENSIVE METABOLIC PANEL    EKG EKG Interpretation Date/Time:  Sunday June 13 2023 21:10:35 EDT Ventricular Rate:  66 PR Interval:  248 QRS Duration:  87 QT Interval:  426 QTC Calculation: 447 R Axis:   71  Text Interpretation: Sinus rhythm Prolonged PR interval Borderline ST depression, diffuse leads No significant change since last tracing Confirmed by Elayne Snare ([id number]) on 06/13/2023 9:24:23 PM  Radiology DG Chest Port 1 View  Result Date: 06/13/2023 CLINICAL DATA:  Altered mental status EXAM: PORTABLE CHEST 1 VIEW COMPARISON:  None Available. FINDINGS: Lungs are well expanded, symmetric, and clear. No pneumothorax or pleural effusion. Cardiac size within normal limits. Pulmonary vascularity is normal. Osseous structures are age-appropriate. No acute bone abnormality. IMPRESSION: No active disease. Electronically Signed   By: Helyn Numbers M.D.   On: 06/13/2023 22:12   CT Head Wo Contrast  Result Date: 06/13/2023 CLINICAL DATA:  Headache EXAM: CT HEAD WITHOUT CONTRAST TECHNIQUE: Contiguous axial images  were obtained from the base of the skull through the vertex without intravenous contrast. RADIATION DOSE REDUCTION: This exam was performed according to the departmental dose-optimization program which includes automated exposure control, adjustment of the mA and/or kV according to patient size and/or use of iterative reconstruction technique. COMPARISON:  None Available. FINDINGS: Brain: There is no mass, hemorrhage or extra-axial collection. The size and configuration of the ventricles and extra-axial CSF spaces are normal. The brain parenchyma is normal, without acute or chronic infarction. Vascular: No abnormal hyperdensity of the major intracranial arteries or dural venous sinuses. No intracranial atherosclerosis. Skull: The visualized skull base, calvarium and extracranial soft tissues are normal. Sinuses/Orbits: No fluid  levels or advanced mucosal thickening of the visualized paranasal sinuses. No mastoid or middle ear effusion. The orbits are normal. IMPRESSION: Normal head CT. Electronically Signed   By: Deatra Robinson M.D.   On: 06/13/2023 21:44    Procedures .Critical Care  Performed by: Rexford Maus, DO Authorized by: Rexford Maus, DO   Critical care provider statement:    Critical care time (minutes):  40   Critical care time was exclusive of:  Separately billable procedures and treating other patients   Critical care was necessary to treat or prevent imminent or life-threatening deterioration of the following conditions:  CNS failure or compromise   Critical care was time spent personally by me on the following activities:  Development of treatment plan with patient or surrogate, discussions with consultants, evaluation of patient's response to treatment, examination of patient, obtaining history from patient or surrogate, ordering and review of laboratory studies, ordering and performing treatments and interventions, pulse oximetry, ordering and review of radiographic  studies, re-evaluation of patient's condition and review of old charts   I assumed direction of critical care for this patient from another provider in my specialty: no     Care discussed with: admitting provider       Medications Ordered in ED Medications  nicardipine (CARDENE) 20mg  in 0.86% saline IV infusion (0.1 mg/ml) (0 mg/hr Intravenous Hold 06/13/23 2304)  fentaNYL (SUBLIMAZE) injection 50 mcg (50 mcg Intravenous Given 06/13/23 2125)  metoCLOPramide (REGLAN) injection 10 mg (10 mg Intravenous Given 06/13/23 2307)  acetaminophen (TYLENOL) tablet 1,000 mg (1,000 mg Oral Given 06/13/23 2305)    ED Course/ Medical Decision Making/ A&P Clinical Course as of 06/13/23 2307  Sun Jun 13, 2023  2148 [facility] without active bleed.  [VK]  2211 Patient's BP is still significantly elevated and patient is complaining of headache. Will be started on nicardipine drip for hypertensive emergency. Neurology will be consulted for concern for PRES and patient will rrequire admission. [VK]  2217 I spoke with Dr. Amada Jupiter who agreed with nicardipine drip, will need ICU admission. Recommended checking temperature, considering LP to evaluate for meningitis. [VK]  2248 Patient's oral temperature by myself is 97.7 making concern on meningitis less likely.  Did discuss with patient's family about possible lumbar puncture to further evaluate for cause of her headache and altered mental status however with the patient's confusion and concern for inability to hold still and tolerate the procedure even with sedation family is agreeable to hold off on lumbar puncture at this time.  ICU will be called for admission. [VK]  2258 I spoke with Dr. Katrinka Blazing with ICU who accepts the patient for admission. [VK]    Clinical Course User Index [VK] Rexford Maus, DO                                 Medical Decision Making This patient presents to the ED with chief complaint(s) of headache with pertinent past medical history  of HTN, HLD, CAD which further complicates the presenting complaint. The complaint involves an extensive differential diagnosis and also carries with it a high risk of complications and morbidity.    The differential diagnosis includes ICH, mass effect, PRES, hypertensive emergency, no focal deficits making his CVA less likely, infection, no meningismus and no fever making meningitis less likely, electrolyte abnormality  Additional history obtained: Additional history obtained from family Records reviewed Care Everywhere/External Records and Primary Care  Documents  ED Course and Reassessment: On patient's arrival she was hypertensive, altered and in significant pain.  I was immediately called to bedside to evaluate the patient.  Patient's last known well was several days ago so stroke alert was not called but head CT was immediately ordered and the patient was transferred to CT to evaluate for possible ICH or mass effect versus concern for PRES. the patient has no focal neurologic deficits outside of her confusion.  The patient will additionally have labs, EKG, urine and coags.  Pending her head CT she will require blood pressure control.  Independent labs interpretation:  The following labs were independently interpreted: within normal range  Independent visualization of imaging: - I independently visualized the following imaging with scope of interpretation limited to determining acute life threatening conditions related to emergency care: CTH, which revealed no acute disease  Consultation: - Consulted or discussed management/test interpretation w/ external professional: neurology, critical care  Consideration for admission or further workup: patient requires admission for BP control and w/u of AMS Social Determinants of health: N/A    Amount and/or Complexity of Data Reviewed Labs: ordered. Radiology: ordered.  Risk OTC drugs. Prescription drug management. Decision regarding  hospitalization.          Final Clinical Impression(s) / ED Diagnoses Final diagnoses:  Disorientation  Acute intractable headache, unspecified headache type  Hypertensive emergency    Rx / DC Orders ED Discharge Orders     None         Rexford Maus, DO 06/13/23 2307

## 2023-06-13 NOTE — ED Notes (Signed)
Labs collected.

## 2023-06-13 NOTE — H&P (Incomplete)
NAME:  Tamara Bowen, MRN:  [id number], DOB:  1945/10/30, LOS: 0 ADMISSION DATE:  06/13/2023, CONSULTATION DATE:  9/8 REFERRING MD:  Dr. Theresia Lo, CHIEF COMPLAINT:  htn emergency   History of Present Illness:  Patient is a 77 year old female with pertinent PMH of HTN, HLD, CAD, hypothyroidism presents to [facility] ED on 9/8 with headache and AMS.  Patient has been complaining of headache over the past several days.  Was seen by PCP 2 days ago stating headache was likely due to her high BP.  BP medications were increased.  Headaches continued to worsen and became more confused.  Pertinent  Medical History   Past Medical History:  Diagnosis Date  . Heart attack (HCC)   . High cholesterol   . Hypertension   . Renal disorder      Significant Hospital Events: Including procedures, antibiotic start and stop dates in addition to other pertinent events     Interim History / Subjective:  ***  Objective   Blood pressure (!) 238/69, pulse 70, temperature 97.7 F (36.5 C), temperature source Oral, resp. rate 15, SpO2 99%.       No intake or output data in the 24 hours ending 06/13/23 2356 There were no vitals filed for this visit.  Examination: General:  *** NAD; critically ill appearing on mech vent HEENT: MM pink/moist; ETT in place Neuro: Aox3; MAE; sedated; CV: s1s2, RRR ***, no m/r/g PULM:  dim clear BS bilaterally; [place] ***; on mech vent PRVC GI: soft, bsx4 active  Extremities: warm/dry, *** edema  Skin: no rashes or lesions    Resolved Hospital Problem list     Assessment & Plan:  Hypertensive emergency Plan: - -Will check MRI head -BP goal  -check uds, ethanol,     Best Practice (right click and "Reselect all SmartList Selections" daily)   Diet/type: {diet type:25684} DVT prophylaxis: {anticoagulation (Optional):25687} GI prophylaxis: {XB:28413} Lines: {Central Venous Access:25771} Foley:  {Central Venous Access:25691} Code Status:  {Code  Status:26939} Last date of multidisciplinary goals of care discussion [***]  Labs   CBC: Recent Labs  Lab 06/13/23 2118  WBC 10.2  NEUTROABS 7.5  HGB 13.1  HCT 38.0  MCV 91.3  PLT 231    Basic Metabolic Panel: Recent Labs  Lab 06/13/23 2256  NA 137  K 3.9  CL 103  CO2 22  GLUCOSE 134*  BUN 20  CREATININE 1.15*  CALCIUM 10.8*   GFR: CrCl cannot be calculated (Unknown ideal weight.). Recent Labs  Lab 06/13/23 2118  WBC 10.2    Liver Function Tests: Recent Labs  Lab 06/13/23 2256  AST 19  ALT 17  ALKPHOS 50  BILITOT 0.7  PROT 7.0  ALBUMIN 4.4   No results for input(s): "LIPASE", "AMYLASE" in the last 168 hours. No results for input(s): "AMMONIA" in the last 168 hours.  ABG No results found for: "PHART", "PCO2ART", "PO2ART", "HCO3", "TCO2", "ACIDBASEDEF", "O2SAT"   Coagulation Profile: Recent Labs  Lab 06/13/23 2118  INR 1.0    Cardiac Enzymes: No results for input(s): "CKTOTAL", "CKMB", "CKMBINDEX", "TROPONINI" in the last 168 hours.  HbA1C: No results found for: "HGBA1C"  CBG: Recent Labs  Lab 06/13/23 2142  GLUCAP 131*    Review of Systems:   ***  Past Medical History:  She,  has a past medical history of Heart attack (HCC), High cholesterol, Hypertension, and Renal disorder.   Surgical History:   Past Surgical History:  Procedure Laterality Date  . CORONARY  STENT INTERVENTION    . HIP SURGERY    . PARATHYROIDECTOMY       Social History:   reports that she has never smoked. She has never used smokeless tobacco. She reports that she does not drink alcohol and does not use drugs.   Family History:  Her family history is not on file.   Allergies No Known Allergies   Home Medications  Prior to Admission medications   Medication Sig Start Date End Date Taking? Authorizing Provider  amLODipine (NORVASC) 5 MG tablet Take 5 mg by mouth daily. 02/29/20   [provider]  aspirin 81 MG EC tablet Take by mouth.     [provider]  atorvastatin (LIPITOR) 40 MG tablet Take 40 mg by mouth daily. 01/16/20   [provider]  carvedilol (COREG) 25 MG tablet Take 25 mg by mouth 2 (two) times daily. 01/26/20   [provider]  Cholecalciferol 25 MCG (1000 UT) capsule Take by mouth.    [provider]  clopidogrel (PLAVIX) 75 MG tablet Take 75 mg by mouth daily. 01/26/20   [provider]  famotidine (PEPCID) 20 MG tablet TAKE 2 TABLETS BY MOUTH EVERY EVENING 02/22/20   [provider]  hydrALAZINE (APRESOLINE) 25 MG tablet Take 25 mg by mouth 3 (three) times daily. 12/21/19   [provider]  isosorbide mononitrate (IMDUR) 120 MG 24 hr tablet Take 120 mg by mouth daily. 01/26/20   [provider]  levothyroxine (SYNTHROID) 88 MCG tablet Take 88 mcg by mouth daily. 12/21/19   [provider]  traMADol (ULTRAM) 50 MG tablet Take 1 tablet (50 mg total) by mouth every 6 (six) hours as needed. 03/13/20   Geoffery Lyons, MD     Critical care time: 45 minutes     JD Anselm Lis [facility] 06/13/2023, 11:56 PM  Please see [email or web address] for pager details.  From 7A-7P if no response, please call [phone]. After hours, please call ELink [phone].

## 2023-06-13 NOTE — ED Triage Notes (Addendum)
Pt BIB family with worst headache of her life ongoing for 5-6 days. Pt states 2 weeks, but family reports pt appears confused, and had a difficult time performing simple tasks (ie: took her shoes off 3x after they put them on to bring her in) EDP made aware and came to bedside. Pt is able to speak clearly but difficult to get direct/appropriate answers when assessing/obtaining hx . States she has been lightheaded/dizzy.  Pt holding her head in apparent distress/pain from HA. No LKW as family who brought her in was not with her. Pt reports difficulty seeing as well.

## 2023-06-13 NOTE — ED Notes (Addendum)
Pt taken to CT with 2 RNs and on a monitor. Triage paused for exam/treatment.

## 2023-06-13 NOTE — H&P (Addendum)
NAME:  Tamara Bowen, MRN:  [id number], DOB:  12/23/1945, LOS: 0 ADMISSION DATE:  06/13/2023, CONSULTATION DATE:  9/8 REFERRING MD:  Dr. Theresia Lo, CHIEF COMPLAINT:  htn emergency   History of Present Illness:  Patient is a 77 year old female with pertinent PMH of HTN, HLD, CAD, CKD 3a, hypothyroidism presents to [facility] ED on 9/8 with headache and AMS.  Patient has been complaining of headache over the past several days.  Was seen by PCP 2 days ago stating headache was likely due to her high BP.  BP medications were increased.  Headaches continued to worsen and became more confused. Came to ED for further eval on 9/8.   On arrival to ED, patient altered and complaining of ha. Patient hypertensive 254/76. CT head with no acute abnormality. Patient started on cardene infusion. PCCM consulted for [facility] icu transfer.  Pertinent  Medical History   Past Medical History:  Diagnosis Date   Heart attack (HCC)    High cholesterol    Hypertension    Renal disorder      Significant Hospital Events: Including procedures, antibiotic start and stop dates in addition to other pertinent events   9/8 htn emergency on cardene infusion; pccm consulted  Interim History / Subjective:  Cardene currently on hold; SBP 200 In ED had episode of confusion and pulled out IV; SBP 160s and cardene was held Currently patient sleepy but Aox3, mae symmetrically, perrl  Objective   Blood pressure (!) 238/69, pulse 70, temperature 97.7 F (36.5 C), temperature source Oral, resp. rate 15, SpO2 99%.       No intake or output data in the 24 hours ending 06/13/23 2356 There were no vitals filed for this visit.  Examination: General:  NAD HEENT: MM pink/moist Neuro: sleepy but Aox3; MAE symmetrically; perrl CV: s1s2, RRR, no m/r/g PULM:  dim clear BS bilaterally; ra GI: soft, bsx4 active  Extremities: warm/dry, no edema  Skin: no rashes or lesions    Resolved Hospital Problem list      Assessment & Plan:  Hypertensive emergency Acute hypertensive encephalopathy: possible PRES HA Plan: -neuro consulted; appreciate recs -Will check MRI head -change cardene to cleviprex for SBP goal <200 for 24 hours -check uds, ethanol, ammonia, tsh -echo -limit sedating meds -frequent neuro checks -patient afebrile; low suspicion for meningitis; will hold off on LP at this time -consider renal artery ultrasound  CKD 3a Plan: -Trend BMP / urinary output -Replace electrolytes as indicated -Avoid nephrotoxic agents, ensure adequate renal perfusion  HTN CAD HLD Plan: -resume home meds later today when more awake -cont cleviprex as above  Hypothyroidism Plan: -tsh -resume synthroid  Best Practice (right click and "Reselect all SmartList Selections" daily)   Diet/type: NPO DVT prophylaxis: prophylactic heparin  GI prophylaxis: N/A Lines: N/A Foley:  N/A Code Status:  full code Last date of multidisciplinary goals of care discussion [pending]  Labs   CBC: Recent Labs  Lab 06/13/23 2118  WBC 10.2  NEUTROABS 7.5  HGB 13.1  HCT 38.0  MCV 91.3  PLT 231    Basic Metabolic Panel: Recent Labs  Lab 06/13/23 2256  NA 137  K 3.9  CL 103  CO2 22  GLUCOSE 134*  BUN 20  CREATININE 1.15*  CALCIUM 10.8*   GFR: CrCl cannot be calculated (Unknown ideal weight.). Recent Labs  Lab 06/13/23 2118  WBC 10.2    Liver Function Tests: Recent Labs  Lab 06/13/23 2256  AST 19  ALT  17  ALKPHOS 50  BILITOT 0.7  PROT 7.0  ALBUMIN 4.4   No results for input(s): "LIPASE", "AMYLASE" in the last 168 hours. No results for input(s): "AMMONIA" in the last 168 hours.  ABG No results found for: "PHART", "PCO2ART", "PO2ART", "HCO3", "TCO2", "ACIDBASEDEF", "O2SAT"   Coagulation Profile: Recent Labs  Lab 06/13/23 2118  INR 1.0    Cardiac Enzymes: No results for input(s): "CKTOTAL", "CKMB", "CKMBINDEX", "TROPONINI" in the last 168 hours.  HbA1C: No results  found for: "HGBA1C"  CBG: Recent Labs  Lab 06/13/23 2142  GLUCAP 131*    Review of Systems:   Patient is encephalopathic; therefore, history has been obtained from chart review.    Past Medical History:  She,  has a past medical history of Heart attack (HCC), High cholesterol, Hypertension, and Renal disorder.   Surgical History:   Past Surgical History:  Procedure Laterality Date   CORONARY STENT INTERVENTION     HIP SURGERY     PARATHYROIDECTOMY       Social History:   reports that she has never smoked. She has never used smokeless tobacco. She reports that she does not drink alcohol and does not use drugs.   Family History:  Her family history is not on file.   Allergies No Known Allergies   Home Medications  Prior to Admission medications   Medication Sig Start Date End Date Taking? Authorizing Provider  amLODipine (NORVASC) 5 MG tablet Take 5 mg by mouth daily. 02/29/20   [provider]  aspirin 81 MG EC tablet Take by mouth.    [provider]  atorvastatin (LIPITOR) 40 MG tablet Take 40 mg by mouth daily. 01/16/20   [provider]  carvedilol (COREG) 25 MG tablet Take 25 mg by mouth 2 (two) times daily. 01/26/20   [provider]  Cholecalciferol 25 MCG (1000 UT) capsule Take by mouth.    [provider]  clopidogrel (PLAVIX) 75 MG tablet Take 75 mg by mouth daily. 01/26/20   [provider]  famotidine (PEPCID) 20 MG tablet TAKE 2 TABLETS BY MOUTH EVERY EVENING 02/22/20   [provider]  hydrALAZINE (APRESOLINE) 25 MG tablet Take 25 mg by mouth 3 (three) times daily. 12/21/19   [provider]  isosorbide mononitrate (IMDUR) 120 MG 24 hr tablet Take 120 mg by mouth daily. 01/26/20   [provider]  levothyroxine (SYNTHROID) 88 MCG tablet Take 88 mcg by mouth daily. 12/21/19   [provider]  traMADol (ULTRAM) 50 MG tablet Take 1 tablet (50 mg total) by mouth every 6 (six)  hours as needed. 03/13/20   Geoffery Lyons, MD     Critical care time: 45 minutes     JD Anselm Lis [facility] 06/13/2023, 11:56 PM  Please see [email or web address] for pager details.  From 7A-7P if no response, please call [phone]. After hours, please call ELink [phone].

## 2023-06-14 ENCOUNTER — Inpatient Hospital Stay (HOSPITAL_COMMUNITY): Payer: Medicare Other

## 2023-06-14 DIAGNOSIS — Z79899 Other long term (current) drug therapy: Secondary | ICD-10-CM | POA: Diagnosis not present

## 2023-06-14 DIAGNOSIS — Z79890 Hormone replacement therapy (postmenopausal): Secondary | ICD-10-CM | POA: Diagnosis not present

## 2023-06-14 DIAGNOSIS — I2510 Atherosclerotic heart disease of native coronary artery without angina pectoris: Secondary | ICD-10-CM | POA: Diagnosis present

## 2023-06-14 DIAGNOSIS — E039 Hypothyroidism, unspecified: Secondary | ICD-10-CM | POA: Diagnosis present

## 2023-06-14 DIAGNOSIS — I674 Hypertensive encephalopathy: Secondary | ICD-10-CM | POA: Diagnosis present

## 2023-06-14 DIAGNOSIS — E7800 Pure hypercholesterolemia, unspecified: Secondary | ICD-10-CM | POA: Diagnosis present

## 2023-06-14 DIAGNOSIS — R569 Unspecified convulsions: Secondary | ICD-10-CM | POA: Diagnosis not present

## 2023-06-14 DIAGNOSIS — N179 Acute kidney failure, unspecified: Secondary | ICD-10-CM | POA: Diagnosis present

## 2023-06-14 DIAGNOSIS — Z7902 Long term (current) use of antithrombotics/antiplatelets: Secondary | ICD-10-CM | POA: Diagnosis not present

## 2023-06-14 DIAGNOSIS — I252 Old myocardial infarction: Secondary | ICD-10-CM | POA: Diagnosis not present

## 2023-06-14 DIAGNOSIS — F05 Delirium due to known physiological condition: Secondary | ICD-10-CM | POA: Diagnosis present

## 2023-06-14 DIAGNOSIS — N1831 Chronic kidney disease, stage 3a: Secondary | ICD-10-CM | POA: Diagnosis present

## 2023-06-14 DIAGNOSIS — Z91148 Patient's other noncompliance with medication regimen for other reason: Secondary | ICD-10-CM | POA: Diagnosis not present

## 2023-06-14 DIAGNOSIS — Z7982 Long term (current) use of aspirin: Secondary | ICD-10-CM | POA: Diagnosis not present

## 2023-06-14 DIAGNOSIS — R410 Disorientation, unspecified: Secondary | ICD-10-CM

## 2023-06-14 DIAGNOSIS — I161 Hypertensive emergency: Secondary | ICD-10-CM

## 2023-06-14 DIAGNOSIS — E860 Dehydration: Secondary | ICD-10-CM | POA: Diagnosis not present

## 2023-06-14 DIAGNOSIS — R4182 Altered mental status, unspecified: Secondary | ICD-10-CM | POA: Diagnosis present

## 2023-06-14 DIAGNOSIS — I129 Hypertensive chronic kidney disease with stage 1 through stage 4 chronic kidney disease, or unspecified chronic kidney disease: Secondary | ICD-10-CM | POA: Diagnosis present

## 2023-06-14 DIAGNOSIS — G9341 Metabolic encephalopathy: Secondary | ICD-10-CM | POA: Diagnosis present

## 2023-06-14 DIAGNOSIS — I951 Orthostatic hypotension: Secondary | ICD-10-CM | POA: Diagnosis present

## 2023-06-14 DIAGNOSIS — F0390 Unspecified dementia without behavioral disturbance: Secondary | ICD-10-CM | POA: Diagnosis present

## 2023-06-14 DIAGNOSIS — Z955 Presence of coronary angioplasty implant and graft: Secondary | ICD-10-CM | POA: Diagnosis not present

## 2023-06-14 LAB — CBC
HCT: 37 % (ref 36.0–46.0)
Hemoglobin: 12.4 g/dL (ref 12.0–15.0)
MCH: 30.5 pg (ref 26.0–34.0)
MCHC: 33.5 g/dL (ref 30.0–36.0)
MCV: 91.1 fL (ref 80.0–100.0)
Platelets: 224 10*3/uL (ref 150–400)
RBC: 4.06 MIL/uL (ref 3.87–5.11)
RDW: 14.5 % (ref 11.5–15.5)
WBC: 8.9 10*3/uL (ref 4.0–10.5)
nRBC: 0 % (ref 0.0–0.2)

## 2023-06-14 LAB — COMPREHENSIVE METABOLIC PANEL WITH GFR
ALT: 14 U/L (ref 0–44)
AST: 17 U/L (ref 15–41)
Albumin: 4 g/dL (ref 3.5–5.0)
Alkaline Phosphatase: 44 U/L (ref 38–126)
Anion gap: 11 (ref 5–15)
BUN: 19 mg/dL (ref 8–23)
CO2: 22 mmol/L (ref 22–32)
Calcium: 10.1 mg/dL (ref 8.9–10.3)
Chloride: 103 mmol/L (ref 98–111)
Creatinine, Ser: 1.16 mg/dL — ABNORMAL HIGH (ref 0.44–1.00)
GFR, Estimated: 49 mL/min — ABNORMAL LOW
Glucose, Bld: 104 mg/dL — ABNORMAL HIGH (ref 70–99)
Potassium: 3.3 mmol/L — ABNORMAL LOW (ref 3.5–5.1)
Sodium: 136 mmol/L (ref 135–145)
Total Bilirubin: 1 mg/dL (ref 0.3–1.2)
Total Protein: 6.5 g/dL (ref 6.5–8.1)

## 2023-06-14 LAB — GLUCOSE, CAPILLARY
Glucose-Capillary: 138 mg/dL — ABNORMAL HIGH (ref 70–99)
Glucose-Capillary: 120 mg/dL — ABNORMAL HIGH (ref 70–99)
Glucose-Capillary: 95 mg/dL (ref 70–99)
Glucose-Capillary: 99 mg/dL (ref 70–99)
Glucose-Capillary: 125 mg/dL — ABNORMAL HIGH (ref 70–99)

## 2023-06-14 LAB — T4, FREE: Free T4: 0.88 ng/dL (ref 0.61–1.12)

## 2023-06-14 LAB — MAGNESIUM: Magnesium: 1.9 mg/dL (ref 1.7–2.4)

## 2023-06-14 LAB — AMMONIA: Ammonia: 17 umol/L (ref 9–35)

## 2023-06-14 LAB — VITAMIN B12: Vitamin B-12: 1044 pg/mL — ABNORMAL HIGH (ref 180–914)

## 2023-06-14 LAB — TSH: TSH: 8.866 u[IU]/mL — ABNORMAL HIGH (ref 0.350–4.500)

## 2023-06-14 LAB — ETHANOL: Alcohol, Ethyl (B): <10 mg/dL

## 2023-06-14 LAB — MRSA NEXT GEN BY PCR, NASAL: MRSA by PCR Next Gen: NOT DETECTED

## 2023-06-14 MED ORDER — THIAMINE HCL 100 MG/ML IJ SOLN
500.0000 mg | Freq: Three times a day (TID) | INTRAVENOUS | Status: AC
  Administered 2023-06-14 – 2023-06-17 (×8): 500 mg via INTRAVENOUS
  Filled 2023-06-14 (×9): qty 5

## 2023-06-14 MED ORDER — LORAZEPAM 2 MG/ML IJ SOLN
1.0000 mg | Freq: Once | INTRAMUSCULAR | Status: AC
  Administered 2023-06-14: 1 mg via INTRAVENOUS

## 2023-06-14 MED ORDER — POLYETHYLENE GLYCOL 3350 17 G PO PACK: 17.0000 g | PACK | Freq: Every day | ORAL | Status: DC | PRN

## 2023-06-14 MED ORDER — LISINOPRIL 10 MG PO TABS
10.0000 mg | ORAL_TABLET | Freq: Every day | ORAL | Status: DC
  Administered 2023-06-14 – 2023-06-16 (×3): 10 mg via ORAL
  Filled 2023-06-14 (×3): qty 1

## 2023-06-14 MED ORDER — AMLODIPINE BESYLATE 10 MG PO TABS
10.0000 mg | ORAL_TABLET | Freq: Every day | ORAL | Status: DC
  Administered 2023-06-15 – 2023-06-19 (×5): 10 mg via ORAL
  Filled 2023-06-14 (×5): qty 1

## 2023-06-14 MED ORDER — HEPARIN SODIUM (PORCINE) 5000 UNIT/ML IJ SOLN
5000.0000 [IU] | Freq: Three times a day (TID) | INTRAMUSCULAR | Status: DC
  Administered 2023-06-14 – 2023-06-19 (×15): 5000 [IU] via SUBCUTANEOUS
  Filled 2023-06-14 (×15): qty 1

## 2023-06-14 MED ORDER — LORAZEPAM 2 MG/ML IJ SOLN
0.5000 mg | Freq: Once | INTRAMUSCULAR | Status: AC | PRN
  Administered 2023-06-14: 0.5 mg via INTRAVENOUS
  Filled 2023-06-14: qty 1

## 2023-06-14 MED ORDER — ACETAMINOPHEN 325 MG PO TABS: 650.0000 mg | ORAL_TABLET | ORAL | Status: DC | PRN

## 2023-06-14 MED ORDER — POTASSIUM CHLORIDE 10 MEQ/100ML IV SOLN
10.0000 meq | INTRAVENOUS | Status: DC
  Filled 2023-06-14 (×4): qty 100

## 2023-06-14 MED ORDER — MAGNESIUM SULFATE 2 GM/50ML IV SOLN
2.0000 g | Freq: Once | INTRAVENOUS | Status: AC
  Administered 2023-06-14: 2 g via INTRAVENOUS
  Filled 2023-06-14: qty 50

## 2023-06-14 MED ORDER — ORAL CARE MOUTH RINSE: 15.0000 mL | OROMUCOSAL | Status: DC | PRN

## 2023-06-14 MED ORDER — DEXMEDETOMIDINE HCL IN NACL 400 MCG/100ML IV SOLN
0.0000 ug/kg/h | INTRAVENOUS | Status: DC
  Administered 2023-06-14: 0.4 ug/kg/h via INTRAVENOUS
  Filled 2023-06-14: qty 100

## 2023-06-14 MED ORDER — ISOSORBIDE MONONITRATE ER 60 MG PO TB24
120.0000 mg | ORAL_TABLET | Freq: Every day | ORAL | Status: DC
  Administered 2023-06-14 – 2023-06-17 (×4): 120 mg via ORAL
  Filled 2023-06-14 (×4): qty 2

## 2023-06-14 MED ORDER — DOCUSATE SODIUM 50 MG/5ML PO LIQD: 100.0000 mg | Freq: Two times a day (BID) | ORAL | Status: DC | PRN

## 2023-06-14 MED ORDER — ACETAMINOPHEN 325 MG PO TABS
650.0000 mg | ORAL_TABLET | ORAL | Status: DC | PRN
  Administered 2023-06-14: 650 mg
  Filled 2023-06-14: qty 2

## 2023-06-14 MED ORDER — CARVEDILOL 25 MG PO TABS
25.0000 mg | ORAL_TABLET | Freq: Two times a day (BID) | ORAL | Status: DC
  Administered 2023-06-14 – 2023-06-17 (×6): 25 mg via ORAL
  Filled 2023-06-14 (×7): qty 1

## 2023-06-14 MED ORDER — CLEVIDIPINE BUTYRATE 0.5 MG/ML IV EMUL
0.0000 mg/h | INTRAVENOUS | Status: DC
  Administered 2023-06-14: 2 mg/h via INTRAVENOUS
  Filled 2023-06-14: qty 100

## 2023-06-14 MED ORDER — HYDRALAZINE HCL 20 MG/ML IJ SOLN
10.0000 mg | INTRAMUSCULAR | Status: DC | PRN
  Administered 2023-06-14 (×2): 10 mg via INTRAVENOUS
  Filled 2023-06-14 (×2): qty 1

## 2023-06-14 MED ORDER — LORAZEPAM 2 MG/ML IJ SOLN
INTRAMUSCULAR | Status: AC
  Filled 2023-06-14: qty 1

## 2023-06-14 MED ORDER — CHLORHEXIDINE GLUCONATE CLOTH 2 % EX PADS
6.0000 | MEDICATED_PAD | Freq: Every day | CUTANEOUS | Status: DC
  Administered 2023-06-14 (×2): 6 via TOPICAL

## 2023-06-14 MED ORDER — ISOSORBIDE MONONITRATE ER 60 MG PO TB24
120.0000 mg | ORAL_TABLET | Freq: Every day | ORAL | Status: DC
  Filled 2023-06-14: qty 2

## 2023-06-14 MED ORDER — FENTANYL CITRATE PF 50 MCG/ML IJ SOSY
50.0000 ug | PREFILLED_SYRINGE | Freq: Once | INTRAMUSCULAR | Status: AC
  Administered 2023-06-14: 50 ug via INTRAVENOUS
  Filled 2023-06-14: qty 1

## 2023-06-14 MED ORDER — LEVOTHYROXINE SODIUM 88 MCG PO TABS
88.0000 ug | ORAL_TABLET | Freq: Every day | ORAL | Status: DC
  Administered 2023-06-16 – 2023-06-19 (×4): 88 ug via ORAL
  Filled 2023-06-14 (×6): qty 1

## 2023-06-14 MED ORDER — AMLODIPINE BESYLATE 5 MG PO TABS
5.0000 mg | ORAL_TABLET | Freq: Once | ORAL | Status: AC
  Administered 2023-06-14: 5 mg via ORAL
  Filled 2023-06-14: qty 1

## 2023-06-14 MED ORDER — QUETIAPINE FUMARATE 25 MG PO TABS
12.5000 mg | ORAL_TABLET | Freq: Every evening | ORAL | Status: DC | PRN
  Administered 2023-06-14 – 2023-06-18 (×6): 12.5 mg via ORAL
  Filled 2023-06-14 (×6): qty 1

## 2023-06-14 MED ORDER — POTASSIUM CHLORIDE CRYS ER 20 MEQ PO TBCR
40.0000 meq | EXTENDED_RELEASE_TABLET | ORAL | Status: AC
  Administered 2023-06-14 (×2): 40 meq via ORAL
  Filled 2023-06-14 (×2): qty 2

## 2023-06-14 MED ORDER — AMLODIPINE BESYLATE 5 MG PO TABS
5.0000 mg | ORAL_TABLET | Freq: Every day | ORAL | Status: DC
  Administered 2023-06-14: 5 mg via ORAL
  Filled 2023-06-14: qty 1

## 2023-06-14 MED ORDER — HYDRALAZINE HCL 25 MG PO TABS: 25.0000 mg | ORAL_TABLET | Freq: Three times a day (TID) | ORAL | Status: DC

## 2023-06-14 NOTE — Procedures (Signed)
Patient Name: Tamara Bowen  MRN: [id number]  Epilepsy Attending: Charlsie Quest  Referring Physician/Provider: Gordy Councilman, MD  Date: 06/14/2023 Duration: 23.25 mins  Patient history: 77 year old female presenting with encephalopathy in the setting of severe hypertension getting eeg to evaluate for seizure.  Level of alertness: Awake  AEDs during EEG study: None  Technical aspects: This EEG study was done with scalp electrodes positioned according to the 10-20 International system of electrode placement. Electrical activity was reviewed with band pass filter of 1-70Hz , sensitivity of 7 uV/mm, display speed of 110mm/sec with a 60Hz  notched filter applied as appropriate. EEG data were recorded continuously and digitally stored.  Video monitoring was available and reviewed as appropriate.  Description: The posterior dominant rhythm consists of 8 Hz activity of moderate voltage (25-35 uV) seen predominantly in posterior head regions, symmetric and reactive to eye opening and eye closing. EEG showed continuous generalized 3 to 7 Hz theta-delta slowing. Hyperventilation and photic stimulation were not performed.     ABNORMALITY - Continuous slow, generalized  IMPRESSION: This study is suggestive of mild to moderate diffuse encephalopathy. No seizures or epileptiform discharges were seen throughout the recording.  Latron Ribas Annabelle Harman

## 2023-06-14 NOTE — Progress Notes (Addendum)
eLink Physician-Brief Progress Note Patient Name: Tamara Bowen DOB: Feb 20, 1946 MRN: [id number]   Date of Service  06/14/2023  HPI/Events of Note  Tamara Bowen is a 77 y.o. with PMHx of  has a past medical history of Heart attack (HCC), High cholesterol, Hypertension, and Renal disorder.   And headache, Tylenol is not effective.   eICU Interventions  One-time fentanyl dose.     Intervention Category Minor Interventions: Routine modifications to care plan (e.g. PRN medications for pain, fever)  Orris Perin 06/14/2023, 8:08 PM

## 2023-06-14 NOTE — Progress Notes (Signed)
EEG complete - results pending 

## 2023-06-14 NOTE — Progress Notes (Shared)
Neurology Progress Note  Patient ID: Tamara Bowen is a 77 y.o. with PMHx of  has a past medical history of Heart attack (HCC), High cholesterol, Hypertension, and Renal disorder.  "Granddaughter at bedside. Notes that at baseline patient is very sharp, balances her checkbook, fills up gas when it's half empty, goes to doctors appointments. In the last month there has been an abrupt personality change - moodiness, not taking pills despite filling them. Came in with headaches. Found to have hypertensive crisis on presentation. " ***  Subjective: ***  Exam: Current vital signs: BP (!) 141/48   Pulse (!) 57   Temp 98.2 F (36.8 C)   Resp 15   Wt 53.8 kg   SpO2 98%   BMI 21.69 kg/m  Vital signs in last 24 hours: Temp:  [97.7 F (36.5 C)-98.4 F (36.9 C)] 98.2 F (36.8 C) (09/09 0800) Pulse Rate:  [55-79] 57 (09/09 1000) Resp:  [11-26] 15 (09/09 1000) BP: (115-258)/(46-213) 141/48 (09/09 1000) SpO2:  [96 %-99 %] 98 % (09/09 1000) Weight:  [53.8 kg] 53.8 kg (09/09 0141)   Gen: In bed, comfortable  Resp: non-labored breathing, no grossly audible wheezing Cardiac: Perfusing extremities well  Abd: soft, nt  Neuro: MS: *** CN:*** Motor: *** Sensory:*** DTR:***  Pertinent Labs:  Basic Metabolic Panel: Recent Labs  Lab 06/13/23 2256 06/14/23 0902  NA 137 136  K 3.9 3.3*  CL 103 103  CO2 22 22  GLUCOSE 134* 104*  BUN 20 19  CREATININE 1.15* 1.16*  CALCIUM 10.8* 10.1  MG  --  1.9    CBC: Recent Labs  Lab 06/13/23 Jun 27, 2117 06/14/23 0902  WBC 10.2 8.9  NEUTROABS 7.5  --   HGB 13.1 12.4  HCT 38.0 37.0  MCV 91.3 91.1  PLT 231 224    Coagulation Studies: Recent Labs    06/13/23 06/27/2117  LABPROT 12.9  INR 1.0    MRI brain personally reviewed, agree with radiology:   1. No acute intracranial abnormality. 2. 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  Impression:  ***  Recommendations: - ***  Brooke Dare MD-PhD [facility] [phone]

## 2023-06-14 NOTE — ED Notes (Signed)
Medication paused, primary nurse checking with EDP for specific parameters on lowering BP and dose changes.

## 2023-06-14 NOTE — Progress Notes (Addendum)
eLink Physician-Brief Progress Note Patient Name: Tamara Bowen DOB: 25-Apr-1946 MRN: [id number]   Date of Service  06/14/2023  HPI/Events of Note  77 year old female with a past medical history of hypertension, hyperlipidemia, CAD presenting to the emergency department with headache and altered mental status.  She is being admitted to the ICU with hypertensive emergency and hypertensive encephalopathy with concern for press.   On presentation she is hypertensive to the 250s systolic with a MAP of 118.  Otherwise vital signs are stable.  Initiated on nicardipine drip with a drop in MAP by 25%.  Initial results show mildly elevated creatinine otherwise normal BNP, blood counts, urinalysis, all other laboratory studies.  CT head and chest radiograph unremarkable.  eICU Interventions  Started on dexmedetomidine for encephalopathy  Maintain Cleviprex for hypertension, normalize blood pressure within 24 hours.  Could initiate oral therapies if encephalopathy improves.  Pending MRI brain  Heparin for DVT prophylaxis Home famotidine being held, and no indication for GI prophylaxis otherwise.   0304 -extremely agitated, attempting to get out of bed, rowdy and unable to get MRI images.  Will order Ativan for the Precedex for the scan.  1 mg scheduled.  Additional 0.5 mg aliquots as needed to maintain RASS of 0 to -1.    0439 - MRI w/Solitary chronic microhemorrhage in the cerebellum; BP goal less than systolic 140, maintain MAP 65 or greater.  Intervention Category Evaluation Type: New Patient Evaluation  Tamara Bowen 06/14/2023, 1:40 AM

## 2023-06-14 NOTE — Consult Note (Signed)
Neurology Consultation Reason for Consult: Altered mental status Referring Physician: Theresia Lo, V  CC: Altered mental status  History is obtained from: Chart review  HPI: Tamara Bowen is a 77 y.o. female with a history of hypertension, hyperlipidemia who has had headache for the past week.  She was seen by her PCP who noted that she had severely elevated blood pressure and suspected that this was related.  She has started to seem more confused over the past couple of days she presented the emergency department at [facility] with blood pressures in the mid 200s(as high as 258) and therefore was started on nicardipine drip and has been admitted to the intensive care unit for further management.   Past Medical History:  Diagnosis Date   Heart attack (HCC)    High cholesterol    Hypertension    Renal disorder      No family history on file.   Social History:  reports that she has never smoked. She has never used smokeless tobacco. She reports that she does not drink alcohol and does not use drugs.   Exam: Current vital signs: BP (!) 200/84 (BP Location: Right Arm)   Pulse 71   Temp 98.4 F (36.9 C)   Resp 19   Wt 53.8 kg   SpO2 97%   BMI 21.69 kg/m  Vital signs in last 24 hours: Temp:  [97.7 F (36.5 C)-98.4 F (36.9 C)] 98.4 F (36.9 C) (09/09 0141) Pulse Rate:  [62-79] 71 (09/09 0141) Resp:  [11-26] 19 (09/09 0141) BP: (166-258)/(50-213) 200/84 (09/09 0141) SpO2:  [96 %-99 %] 97 % (09/09 0141) Weight:  [53.8 kg] 53.8 kg (09/09 0141)   Physical Exam  Appears well-developed and well-nourished.   Neuro: Mental Status: Patient is awake, alert, oriented to person, place,  year, and situation.  She cannot give me the month Patient is able to give a clear and coherent history. No signs of aphasia or neglect Cranial Nerves: II: I question a possible left field cut, but her answers are inconsistent and difficult to judge. Pupils are equal, round, and  reactive to light.   III,IV, VI: EOMI without ptosis or diploplia.  V: Facial sensation is symmetric to temperature VII: Facial movement is symmetric.  VIII: hearing is intact to voice X: Uvula elevates symmetrically XI: Shoulder shrug is symmetric. XII: tongue is midline without atrophy or fasciculations.  Motor: Tone is normal. Bulk is normal. 5/5 strength was present in all four extremities.  Sensory: Sensation is symmetric to light touch and temperature in the arms and legs. Cerebellar: No clear ataxia   I have reviewed labs in epic and the results pertinent to this consultation are: Results for orders placed or performed during the hospital encounter of 06/13/23 (from the past 24 hour(s))  Brain natriuretic peptide     Status: None   Collection Time: 06/13/23  9:18 PM  Result Value Ref Range   B Natriuretic Peptide 28.3 0.0 - 100.0 pg/mL  CBC with Differential     Status: None   Collection Time: 06/13/23  9:18 PM  Result Value Ref Range   WBC 10.2 4.0 - 10.5 K/uL   RBC 4.16 3.87 - 5.11 MIL/uL   Hemoglobin 13.1 12.0 - 15.0 g/dL   HCT 09.8 11.9 - 14.7 %   MCV 91.3 80.0 - 100.0 fL   MCH 31.5 26.0 - 34.0 pg   MCHC 34.5 30.0 - 36.0 g/dL   RDW 82.9 56.2 - 13.0 %  Platelets 231 150 - 400 K/uL   nRBC 0.0 0.0 - 0.2 %   Neutrophils Relative % 73 %   Neutro Abs 7.5 1.7 - 7.7 K/uL   Lymphocytes Relative 16 %   Lymphs Abs 1.7 0.7 - 4.0 K/uL   Monocytes Relative 9 %   Monocytes Absolute 0.9 0.1 - 1.0 K/uL   Eosinophils Relative 1 %   Eosinophils Absolute 0.1 0.0 - 0.5 K/uL   Basophils Relative 1 %   Basophils Absolute 0.1 0.0 - 0.1 K/uL   Immature Granulocytes 0 %   Abs Immature Granulocytes 0.04 0.00 - 0.07 K/uL  Protime-INR     Status: None   Collection Time: 06/13/23  9:18 PM  Result Value Ref Range   Prothrombin Time 12.9 11.4 - 15.2 seconds   INR 1.0 0.8 - 1.2  CBG monitoring, ED     Status: Abnormal   Collection Time: 06/13/23  9:42 PM  Result Value Ref Range    Glucose-Capillary 131 (H) 70 - 99 mg/dL  Comprehensive metabolic panel     Status: Abnormal   Collection Time: 06/13/23 10:56 PM  Result Value Ref Range   Sodium 137 135 - 145 mmol/L   Potassium 3.9 3.5 - 5.1 mmol/L   Chloride 103 98 - 111 mmol/L   CO2 22 22 - 32 mmol/L   Glucose, Bld 134 (H) 70 - 99 mg/dL   BUN 20 8 - 23 mg/dL   Creatinine, Ser 1.61 (H) 0.44 - 1.00 mg/dL   Calcium 09.6 (H) 8.9 - 10.3 mg/dL   Total Protein 7.0 6.5 - 8.1 g/dL   Albumin 4.4 3.5 - 5.0 g/dL   AST 19 15 - 41 U/L   ALT 17 0 - 44 U/L   Alkaline Phosphatase 50 38 - 126 U/L   Total Bilirubin 0.7 0.3 - 1.2 mg/dL   GFR, Estimated 49 (L) >60 mL/min   Anion gap 12 5 - 15  Urinalysis, Routine w reflex microscopic -Urine, Clean Catch     Status: Abnormal   Collection Time: 06/13/23 11:37 PM  Result Value Ref Range   Color, Urine YELLOW YELLOW   APPearance HAZY (A) CLEAR   Specific Gravity, Urine 1.020 1.005 - 1.030   pH 7.5 5.0 - 8.0   Glucose, UA NEGATIVE NEGATIVE mg/dL   Hgb urine dipstick NEGATIVE NEGATIVE   Bilirubin Urine NEGATIVE NEGATIVE   Ketones, ur NEGATIVE NEGATIVE mg/dL   Protein, ur 045 (A) NEGATIVE mg/dL   Nitrite NEGATIVE NEGATIVE   Leukocytes,Ua NEGATIVE NEGATIVE  Urinalysis, Microscopic (reflex)     Status: Abnormal   Collection Time: 06/13/23 11:37 PM  Result Value Ref Range   RBC / HPF 0-5 0 - 5 RBC/hpf   WBC, UA 0-5 0 - 5 WBC/hpf   Bacteria, UA RARE (A) NONE SEEN   Squamous Epithelial / HPF 0-5 0 - 5 /HPF   Amorphous Crystal PRESENT   Glucose, capillary     Status: Abnormal   Collection Time: 06/14/23  1:27 AM  Result Value Ref Range   Glucose-Capillary 138 (H) 70 - 99 mg/dL   TSH from 6 days ago was 2.6  I have reviewed the images obtained: CT head-negative  Impression: 77 year old female presenting with encephalopathy in the setting of severe hypertension, 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  She is going for an MRI  for further evaluation.  With her duration of symptoms, but she is afebrile and has no leukocytosis, I agree that infection is  less likely, but if she does not have clear changes on her MRI and does not improve with blood pressure control may need to consider this still (it was already discussed with family who wanted to wait).  Acute ischemic stroke would be another consideration.  Recommendations: 1) MRI brain 2) BP control 3) ammonia 4) hold sedating medications 5) neurology will continue to follow    Ritta Slot, MD [facility] [phone]  If 7pm- 7am, please page neurology on call as listed in AMION.

## 2023-06-14 NOTE — ED Notes (Signed)
Pt took off gown, cardiac leads, purewick and removed IV from right forearm. Pt stated she doesn't know what's going on, pt was talked to by primary nurse and transport team to calm down and explain situation. Pt cleaned up and hooked back up, sent with transport

## 2023-06-14 NOTE — ED Notes (Signed)
EDP ordered to pause medication to prevent BP from dropping too low
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
## 2023-06-15 DIAGNOSIS — I161 Hypertensive emergency: Secondary | ICD-10-CM | POA: Diagnosis not present

## 2023-06-15 LAB — BASIC METABOLIC PANEL
Anion gap: 8 (ref 5–15)
BUN: 24 mg/dL — ABNORMAL HIGH (ref 8–23)
CO2: 19 mmol/L — ABNORMAL LOW (ref 22–32)
Calcium: 9.8 mg/dL (ref 8.9–10.3)
Chloride: 107 mmol/L (ref 98–111)
Creatinine, Ser: 1.69 mg/dL — ABNORMAL HIGH (ref 0.44–1.00)
GFR, Estimated: 31 mL/min — ABNORMAL LOW (ref >60)
Glucose, Bld: 142 mg/dL — ABNORMAL HIGH (ref 70–99)
Potassium: 4.5 mmol/L (ref 3.5–5.1)
Sodium: 134 mmol/L — ABNORMAL LOW (ref 135–145)

## 2023-06-15 LAB — GLUCOSE, CAPILLARY: Glucose-Capillary: 94 mg/dL (ref 70–99)

## 2023-06-15 MED ORDER — ACETAMINOPHEN 325 MG PO TABS
650.0000 mg | ORAL_TABLET | ORAL | Status: DC | PRN
  Administered 2023-06-15 – 2023-06-17 (×3): 650 mg via ORAL
  Filled 2023-06-15 (×3): qty 2

## 2023-06-15 MED ORDER — DOCUSATE SODIUM 100 MG PO CAPS: 100.0000 mg | ORAL_CAPSULE | Freq: Two times a day (BID) | ORAL | Status: DC | PRN

## 2023-06-15 MED ORDER — DOCUSATE SODIUM 50 MG/5ML PO LIQD: 100.0000 mg | Freq: Two times a day (BID) | ORAL | Status: DC | PRN

## 2023-06-15 MED ORDER — POLYETHYLENE GLYCOL 3350 17 G PO PACK: 17.0000 g | PACK | Freq: Every day | ORAL | Status: DC | PRN
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
## 2023-06-15 NOTE — Plan of Care (Signed)

## 2023-06-15 NOTE — TOC CM/SW Note (Signed)
Transition of Care [facility]) - Inpatient Brief Assessment   Patient Details  Name: Tamara Bowen MRN: [id number] Date of Birth: 23-Apr-1946  [facility] of [facility]) CM/SW Contact:    Tom-Johnson, Hershal Coria, RN Phone Number: 06/15/2023, 12:55 PM   Clinical Narrative:  Patient presented to the ED with Headache and Altered Mental Status.  MRI shows Solitary Chronic Micro Hemorrhage in the Rt Cerebellum. EEG concerning for Moderate Diffuse Encephalopathy, no Seizures or Epileptiform. Neurology following.    From home with grand daughter, [name] who is at patient's bedside.  Has four children, one lives out of state. Patient was driving self till recently and [name] now drives to and from appointments.  Has a cane, walker, w/c and shower seat at home.  PCP is Vernona Rieger, MD and uses [facility] on [address] in [place].   No TOC needs or recommendations noted at this time. No PT f/u noted. Patient not Medically ready for discharge.  CM will continue to follow as patient progresses with care towards discharge.      Transition of Care Asessment: Insurance and Status: Insurance coverage has been reviewed Patient has primary care physician: Yes Home environment has been reviewed: Yes Prior level of function:: Modifoed Independent Prior/Current Home Services: No current home services Social Determinants of Health Reivew: SDOH reviewed no interventions necessary Readmission risk has been reviewed: Yes Transition of care needs: no transition of care needs at this time

## 2023-06-15 NOTE — Evaluation (Signed)
Physical Therapy Evaluation/ Discharge Patient Details Name: Tamara Bowen MRN: [id number] DOB: 11/27/1945 Today's Date: 06/15/2023  History of Present Illness  77 yo admitted 9/8 with HA and AMS. 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 EEG with encephalopathy. PMhx; cardiac arrest, HTN, HLD, CAD, CKD  Clinical Impression  Pt pleasant and smiling stating she is still cooking. Pt is living with granddaughter and grandson has been there for 7 months but pt stating he "just visits" and unable to recall him living there. Pt easily distracted by objects in room and lines, unable to follow multistep commands or recognize errors. Family reports they can provide 24hr supervision and present to witness pt errors with direction and inability to recall 911. Encouraged a life alert type system for ease of use if pt is alone and no DME needs at this time. All questions answered for family and no further acute P.T. at this time will defer to OT. Encouraged daily mobility with nursing staff.         If plan is discharge home, recommend the following: A little help with walking and/or transfers;Assistance with cooking/housework;Direct supervision/assist for medications management;Assist for transportation;Supervision due to cognitive status;Direct supervision/assist for financial management;A little help with bathing/dressing/bathroom   Can travel by private vehicle        Equipment Recommendations None recommended by PT  Recommendations for Other Services       Functional Status Assessment Patient has not had a recent decline in their functional status     Precautions / Restrictions Precautions Precautions: Fall      Mobility  Bed Mobility Overal bed mobility: Needs Assistance Bed Mobility: Supine to Sit     Supine to sit: Supervision     General bed mobility comments: HOb 15 degrees with supervision for lines and safety    Transfers Overall transfer level: Needs assistance    Transfers: Sit to/from Stand, Bed to chair/wheelchair/BSC Sit to Stand: Supervision           General transfer comment: supervision for lines and safety    Ambulation/Gait Ambulation/Gait assistance: Supervision Gait Distance (Feet): 300 Feet Assistive device: None Gait Pattern/deviations: Step-through pattern, Decreased stride length   Gait velocity interpretation: 1.31 - 2.62 ft/sec, indicative of limited community ambulator   General Gait Details: pt with veering toward right at times with cues for direction, pt needing direct supervision as pt unable to recall room number or find appropriately  Stairs Stairs: Yes Stairs assistance: Supervision Stair Management: Alternating pattern, Forwards, One rail Right Number of Stairs: 11 General stair comments: pt tripping toe on first step with guarding for safety, reliance on rail  Wheelchair Mobility     Tilt Bed    Modified Rankin (Stroke Patients Only)       Balance Overall balance assessment: Mild deficits observed, not formally tested                                           Pertinent Vitals/Pain Pain Assessment Pain Assessment: No/denies pain    Home Living Family/patient expects to be discharged to:: Private residence Living Arrangements: Other relatives Available Help at Discharge: Available 24 hours/day Type of Home: House Home Access: Level entry     Alternate Level Stairs-Number of Steps: 14 Home Layout: Two level;Able to live on main level with bedroom/bathroom Home Equipment: Rolling Walker (2 wheels);Shower seat;Cane - single point;Wheelchair - manual  Prior Function Prior Level of Function : Driving;Independent/Modified Independent             Mobility Comments: per family and pt was completely independent without falls ADLs Comments: still performing ADLs, driving, cooking and managing medication. slow decline in cognition last 7 months with sharp contrast the last  month     Extremity/Trunk Assessment   Upper Extremity Assessment Upper Extremity Assessment: Generalized weakness    Lower Extremity Assessment Lower Extremity Assessment: Generalized weakness    Cervical / Trunk Assessment Cervical / Trunk Assessment: Normal  Communication   Communication Communication: No apparent difficulties Cueing Techniques: Verbal cues  Cognition Arousal: Alert Behavior During Therapy: WFL for tasks assessed/performed Overall Cognitive Status: Impaired/Different from baseline Area of Impairment: Orientation, Attention, Memory, Following commands, Safety/judgement, Problem solving, Awareness                 Orientation Level: Disoriented to, Time Current Attention Level: Sustained Memory: Decreased short-term memory Following Commands: Follows one step commands with increased time Safety/Judgement: Decreased awareness of safety, Decreased awareness of deficits   Problem Solving: Slow processing General Comments: pt unable to recall day of the week. Given choices on tray pt having difficulty recognizing to comb hair with comb not toothbrush and utilized toothbrush on hair. Unable to recognize errors or follow 2 step commands. could not recall "911"        General Comments      Exercises     Assessment/Plan    PT Assessment Patient does not need any further PT services  PT Problem List         PT Treatment Interventions      PT Goals (Current goals can be found in the Care Plan section)  Acute Rehab PT Goals PT Goal Formulation: All assessment and education complete, DC therapy    Frequency       Co-evaluation               AM-PAC PT "6 Clicks" Mobility  Outcome Measure Help needed turning from your back to your side while in a flat bed without using bedrails?: None Help needed moving from lying on your back to sitting on the side of a flat bed without using bedrails?: None Help needed moving to and from a bed to a  chair (including a wheelchair)?: A Little Help needed standing up from a chair using your arms (e.g., wheelchair or bedside chair)?: A Little Help needed to walk in hospital room?: A Little Help needed climbing 3-5 steps with a railing? : A Little 6 Click Score: 20    End of Session Equipment Utilized During Treatment: Gait belt Activity Tolerance: Patient tolerated treatment well Patient left: in chair;with call bell/phone within reach;with family/visitor present Nurse Communication: Mobility status PT Visit Diagnosis: Other abnormalities of gait and mobility (R26.89)    Time: 1324-4010 PT Time Calculation (min) (ACUTE ONLY): 27 min   Charges:   PT Evaluation $PT Eval Moderate Complexity: 1 Mod PT Treatments $Therapeutic Activity: 8-22 mins PT General Charges $$ ACUTE PT VISIT: 1 Visit         Merryl Hacker, PT Acute Rehabilitation Services Office: [phone]   Enedina Finner Asani Mcburney 06/15/2023, 12:13 PM

## 2023-06-15 NOTE — Plan of Care (Signed)
  Problem: Clinical Measurements: Goal: Ability to maintain clinical measurements within normal limits will improve Outcome: Progressing   

## 2023-06-15 NOTE — Progress Notes (Addendum)
NAME:  Tamara Bowen, MRN:  [id number], DOB:  28-Nov-1945, LOS: 1 ADMISSION DATE:  06/13/2023, CONSULTATION DATE:  9/8 REFERRING MD:  Dr. Theresia Lo, CHIEF COMPLAINT:  HTN emergency   History of Present Illness:  Patient is a 77 year old female with pertinent PMH of HTN,HLD,CAD,CKD 3a, hypothyroidism presented to [facility] ED on 9/8 with headaches and AMS. HTN of 254/76, started on cardene infusion and admitted to [facility] icu.   Hypertension has improved and patient is on oral Amlodipine, Carvedilol, Imdur and Lisinopril. She has waxing and waning confusion. Further history obtained from family and was c/f progressive dementia.  Pertinent  Medical History  HTN,HLD,CAD,CKD 3a, hypothyroidism  Significant Hospital Events: Including procedures, antibiotic start and stop dates in addition to other pertinent events   9/8 admitted d/t htn emergency 9/9 PM, off IV BP medications  Interim History / Subjective:  Patient is A&O x3 and non-combative, she reports no pain. Family at bedside state she has become physically weaker in the recent weeks.  Objective   Blood pressure (!) 151/57, pulse 73, temperature 99 F (37.2 C), temperature source Oral, resp. rate 18, weight 53.8 kg, SpO2 97%.        Intake/Output Summary (Last 24 hours) at 06/15/2023 0837 Last data filed at 06/14/2023 2300 Gross per 24 hour  Intake 115.6 ml  Output 600 ml  Net -484.4 ml   Filed Weights   06/14/23 0141  Weight: 53.8 kg    Examination: General: NAD, chronically ill-appearing HENT: Normocephalic, atraumatic head. Normal external ear and nose. Normal conjunctiva. Lungs: CTAB, normal WOB on RA. Cardiovascular: RRR, no murmurs Abdomen: Soft, not tender, not distended. Extremities: Moving all 4 extremities, no edema Neuro: A&Ox3, no over focal neurologic deficits  Resolved Hospital Problem list   Hypertensive emergency Hypertensive encephalopathy  Assessment & Plan:  Hypertension HTN improving.  Suspect medication non-adherence in addition to resistant HTN. -Continue amlodipine, coreg, lisinopril, IMDUR -Monitor BP -Will need OP f/u to assess efficacy and compliance  ConfusionDementia A&Ox3 this AM. Pending complete work-up for reversible dementia causes, however unremarkable thus far. Family reports worsened physical strength c/f home safety. -Neurology consulted, appreciate recs -Seroquel 12.5 mg at bedtime, additional 12.5 mg PRN. -Thiamine supp until B1 level is back -Delirium precautions -PT/OT eval and treat  CKD 3a -Trend BMP/UOP  CAD HLD -Plan to restart home meds  Hypothyroidism TSH elevated, free T4 WNL -Continue levothyroxine -Recommend OP f/u   Best Practice (right click and "Reselect all SmartList Selections" daily)   Diet/type: Regular consistency (see orders) DVT prophylaxis: prophylactic heparin  GI prophylaxis: N/A Lines: N/A Foley:  N/A Code Status:  full code Last date of multidisciplinary goals of care discussion [Pending]  Labs   CBC: Recent Labs  Lab 06/13/23 2118 06/14/23 0902  WBC 10.2 8.9  NEUTROABS 7.5  --   HGB 13.1 12.4  HCT 38.0 37.0  MCV 91.3 91.1  PLT 231 224    Basic Metabolic Panel: Recent Labs  Lab 06/13/23 2256 06/14/23 0902  NA 137 136  K 3.9 3.3*  CL 103 103  CO2 22 22  GLUCOSE 134* 104*  BUN 20 19  CREATININE 1.15* 1.16*  CALCIUM 10.8* 10.1  MG  --  1.9   GFR: CrCl cannot be calculated (Unknown ideal weight.). Recent Labs  Lab 06/13/23 2118 06/14/23 0902  WBC 10.2 8.9    Liver Function Tests: Recent Labs  Lab 06/13/23 2256 06/14/23 0902  AST 19 17  ALT 17 14  ALKPHOS 50 44  BILITOT 0.7 1.0  PROT 7.0 6.5  ALBUMIN 4.4 4.0   No results for input(s): "LIPASE", "AMYLASE" in the last 168 hours. Recent Labs  Lab 06/14/23 0902  AMMONIA 17    ABG No results found for: "PHART", "PCO2ART", "PO2ART", "HCO3", "TCO2", "ACIDBASEDEF", "O2SAT"   Coagulation Profile: Recent Labs  Lab  06/13/23 2118  INR 1.0    Cardiac Enzymes: No results for input(s): "CKTOTAL", "CKMB", "CKMBINDEX", "TROPONINI" in the last 168 hours.  HbA1C: No results found for: "HGBA1C"  CBG: Recent Labs  Lab 06/14/23 0406 06/14/23 0757 06/14/23 1142 06/14/23 1928 06/15/23 0311  GLUCAP 120* 95 99 125* 94    Past Medical History:  She,  has a past medical history of Heart attack (HCC), High cholesterol, Hypertension, and Renal disorder.   Surgical History:   Past Surgical History:  Procedure Laterality Date   CORONARY STENT INTERVENTION     HIP SURGERY     PARATHYROIDECTOMY       Social History:   reports that she has never smoked. She has never used smokeless tobacco. She reports that she does not drink alcohol and does not use drugs.   Family History:  Her family history is not on file.   Allergies No Known Allergies   Home Medications  Prior to Admission medications   Medication Sig Start Date End Date Taking? Authorizing Provider  amLODipine (NORVASC) 5 MG tablet Take 5 mg by mouth daily. 02/29/20   [provider]  aspirin 81 MG EC tablet Take by mouth.    [provider]  atorvastatin (LIPITOR) 40 MG tablet Take 40 mg by mouth daily. 01/16/20   [provider]  carvedilol (COREG) 25 MG tablet Take 25 mg by mouth 2 (two) times daily. 01/26/20   [provider]  Cholecalciferol 25 MCG (1000 UT) capsule Take by mouth.    [provider]  clopidogrel (PLAVIX) 75 MG tablet Take 75 mg by mouth daily. 01/26/20   [provider]  famotidine (PEPCID) 20 MG tablet TAKE 2 TABLETS BY MOUTH EVERY EVENING 02/22/20   [provider]  hydrALAZINE (APRESOLINE) 25 MG tablet Take 25 mg by mouth 3 (three) times daily. 12/21/19   [provider]  isosorbide mononitrate (IMDUR) 120 MG 24 hr tablet Take 120 mg by mouth daily. 01/26/20   [provider]  levothyroxine (SYNTHROID) 88 MCG tablet Take 88 mcg by mouth  daily. 12/21/19   [provider]  traMADol (ULTRAM) 50 MG tablet Take 1 tablet (50 mg total) by mouth every 6 (six) hours as needed. 03/13/20   Geoffery Lyons, MD     Critical care time: 35 min

## 2023-06-16 DIAGNOSIS — I161 Hypertensive emergency: Secondary | ICD-10-CM | POA: Diagnosis not present

## 2023-06-16 DIAGNOSIS — G9341 Metabolic encephalopathy: Secondary | ICD-10-CM | POA: Insufficient documentation

## 2023-06-16 LAB — BASIC METABOLIC PANEL
Anion gap: 8 (ref 5–15)
BUN: 25 mg/dL — ABNORMAL HIGH (ref 8–23)
CO2: 19 mmol/L — ABNORMAL LOW (ref 22–32)
Calcium: 9.5 mg/dL (ref 8.9–10.3)
Chloride: 107 mmol/L (ref 98–111)
Creatinine, Ser: 1.83 mg/dL — ABNORMAL HIGH (ref 0.44–1.00)
GFR, Estimated: 28 mL/min — ABNORMAL LOW (ref >60)
Glucose, Bld: 112 mg/dL — ABNORMAL HIGH (ref 70–99)
Potassium: 4.6 mmol/L (ref 3.5–5.1)
Sodium: 134 mmol/L — ABNORMAL LOW (ref 135–145)

## 2023-06-16 MED ORDER — SIMETHICONE 80 MG PO CHEW
80.0000 mg | CHEWABLE_TABLET | Freq: Four times a day (QID) | ORAL | Status: DC | PRN
  Administered 2023-06-16: 80 mg via ORAL
  Filled 2023-06-16: qty 1

## 2023-06-16 MED ORDER — SODIUM CHLORIDE 0.9 % IV SOLN: INTRAVENOUS | Status: AC

## 2023-06-16 NOTE — Evaluation (Signed)
Occupational Therapy Evaluation Patient Details Name: Tamara Bowen MRN: [id number] DOB: 11/17/1945 Today's Date: 06/16/2023   History of Present Illness 77 yo admitted 9/8 with HA and AMS. 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 EEG with encephalopathy. PMhx; cardiac arrest, HTN, HLD, CAD, CKD   Clinical Impression   PTA, pt lived with grand-daughter's family and was independent in ADL and IADL; although granddaughter reports over the last month pt has become more forgetful and noted incomplete pillbox filling when preparing to come to hospital. Upon eval, pt scoring a 21 on the short blessed test, requiring min cues and probing questions to provide correct answers to home safety including emergency and medication management tasks. Pt overall supervision for OOB mobility for safety and needing up to CGA with onset dizziness. Found to have drop in BP from sitting to standing (see below). MD aware. Will continue to follow. Recommending speech consult for cognition and OP OT at discharge.       If plan is discharge home, recommend the following: A little help with walking and/or transfers;A little help with bathing/dressing/bathroom;Assistance with cooking/housework;Direct supervision/assist for medications management;Direct supervision/assist for financial management;Help with stairs or ramp for entrance;Assist for transportation (have recommended pt receive assist with medication management in the home with grand daughter whom she lives with present in room)    Functional Status Assessment  Patient has had a recent decline in their functional status and demonstrates the ability to make significant improvements in function in a reasonable and predictable amount of time.  Equipment Recommendations  None recommended by OT    Recommendations for Other Services Speech consult (cog)     Precautions / Restrictions Precautions Precautions: Fall Restrictions Weight Bearing  Restrictions: No      Mobility Bed Mobility Overal bed mobility: Needs Assistance Bed Mobility: Supine to Sit     Supine to sit: Supervision     General bed mobility comments: HOb 15 degrees with supervision for lines and safety    Transfers Overall transfer level: Needs assistance   Transfers: Sit to/from Stand, Bed to chair/wheelchair/BSC Sit to Stand: Supervision           General transfer comment: supervision for lines and safety      Balance Overall balance assessment: Mild deficits observed, not formally tested                                         ADL either performed or assessed with clinical judgement   ADL Overall ADL's : Needs assistance/impaired Eating/Feeding: Independent   Grooming: Brushing hair;Supervision/safety;Standing   Upper Body Bathing: Set up;Sitting   Lower Body Bathing: Set up;Sit to/from stand   Upper Body Dressing : Set up;Sitting   Lower Body Dressing: Set up;Sit to/from stand   Toilet Transfer: Supervision/safety;Ambulation   Toileting- Clothing Manipulation and Hygiene: Set up       Functional mobility during ADLs: Supervision/safety;Contact guard assist General ADL Comments: CGA after static standing for prolonged period during grooming due to pt onset of dizziness.     Vision Ability to See in Adequate Light: 0 Adequate Patient Visual Report: No change from baseline Vision Assessment?: No apparent visual deficits     Perception         Praxis         Pertinent Vitals/Pain Pain Assessment Pain Assessment: No/denies pain     Extremity/Trunk Assessment Upper Extremity Assessment  Upper Extremity Assessment: Generalized weakness   Lower Extremity Assessment Lower Extremity Assessment: Generalized weakness   Cervical / Trunk Assessment Cervical / Trunk Assessment: Normal   Communication Communication Communication: No apparent difficulties Cueing Techniques: Verbal cues   Cognition  Arousal: Alert Behavior During Therapy: WFL for tasks assessed/performed Overall Cognitive Status: Impaired/Different from baseline Area of Impairment: Orientation, Attention, Memory, Following commands, Safety/judgement, Problem solving, Awareness                 Orientation Level: Disoriented to, Time (day of month and using context clues to determine day of week) Current Attention Level: Sustained (decreased ability to display sustained attention >30 secs in presence of distraction, but without distraction) Memory: Decreased short-term memory Following Commands: Follows one step commands with increased time Safety/Judgement: Decreased awareness of safety, Decreased awareness of deficits Awareness: Intellectual, Emergent Problem Solving: Slow processing General Comments: Pt needing increased time to follow commands. Pt scoring a 21 on the short blessed test of cognition with difficulty with delayed memory recall, stating months backward, and counting backward. needing cues to attend to task with counting backward as well as during stating months of year in reverse order, although reasoning and deductive reasoning seem fair with problem solving to provide answers. Pt needing min cues to answer home safety questions as well. Grand daughter present and aware. Will continue to follow.     General Comments  Pt endorsing dizzines woth standing at sink so initiated orthostatic vitals. pt BP in sitting 119/59) MAP 77 HR 61; in standing 84/50 (59) HR 61. MD entering room and made aware.    Exercises     Shoulder Instructions      Home Living Family/patient expects to be discharged to:: Private residence Living Arrangements: Other relatives Available Help at Discharge: Available 24 hours/day Type of Home: House Home Access: Level entry     Home Layout: Two level;Able to live on main level with bedroom/bathroom Alternate Level Stairs-Number of Steps: 14 Alternate Level Stairs-Rails:  Right Bathroom Shower/Tub: Chief Strategy Officer: Standard     Home Equipment: Agricultural consultant (2 wheels);Shower seat;Cane - single point;Wheelchair - manual          Prior Functioning/Environment Prior Level of Function : Driving;Independent/Modified Independent             Mobility Comments: per family and pt was completely independent without falls ADLs Comments: still performing ADLs, driving, cooking and managing medication. slow decline in cognition last 7 months with sharp contrast the last month        OT Problem List: Decreased strength;Decreased activity tolerance;Impaired balance (sitting and/or standing);Decreased coordination;Decreased cognition;Decreased safety awareness;Cardiopulmonary status limiting activity      OT Treatment/Interventions: Self-care/ADL training;Therapeutic exercise;DME and/or AE instruction;Therapeutic activities;Cognitive remediation/compensation;Balance training;Patient/family education    OT Goals(Current goals can be found in the care plan section) Acute Rehab OT Goals Patient Stated Goal: get better OT Goal Formulation: With patient Time For Goal Achievement: 06/30/23 Potential to Achieve Goals: Good  OT Frequency: Min 2X/week    Co-evaluation              AM-PAC OT "6 Clicks" Daily Activity     Outcome Measure Help from another person eating meals?: None Help from another person taking care of personal grooming?: A Little Help from another person toileting, which includes using toliet, bedpan, or urinal?: A Little Help from another person bathing (including washing, rinsing, drying)?: A Little Help from another person to put on and taking off  regular upper body clothing?: A Little Help from another person to put on and taking off regular lower body clothing?: A Little 6 Click Score: 19   End of Session Nurse Communication: Mobility status  Activity Tolerance: Patient tolerated treatment well Patient left: in  bed;with call bell/phone within reach;with family/visitor present;with bed alarm set  OT Visit Diagnosis: Unsteadiness on feet (R26.81);Muscle weakness (generalized) (M62.81);Other symptoms and signs involving cognitive function                Time: 9147-8295 OT Time Calculation (min): 30 min Charges:  OT General Charges $OT Visit: 1 Visit OT Evaluation $OT Eval Moderate Complexity: 1 Mod OT Treatments $Self Care/Home Management : 8-22 mins  Tyler Deis, OTR/L [facility] Acute Rehabilitation Office: [phone]   Myrla Halsted 06/16/2023, 8:58 AM

## 2023-06-16 NOTE — Plan of Care (Signed)

## 2023-06-16 NOTE — Progress Notes (Addendum)
PROGRESS NOTE    Tamara Bowen  ZOX:[id number] DOB: October 16, 1945 DOA: 06/13/2023 PCP: Vernona Rieger, MD   Brief Narrative:  Patient is a 77 year old female with pertinent PMH of HTN,HLD,CAD,CKD 3a, hypothyroidism presented to [facility] ED on 9/8 with headaches and AMS. HTN of 254/76, started on cardene infusion and admitted to [facility] icu.    Hypertension improved and patient is on oral Amlodipine, Carvedilol, Imdur and Lisinopril.  Transferred under [facility] 06/16/2023.  Assessment & Plan:   Principal Problem:   Hypertensive emergency Active Problems:   Acute metabolic encephalopathy  Hypertensive urgency: Initially presented with blood pressure of 254/76.  Admitted under ICU, started on Cardene and transition to oral antihypertensives.  Currently on amlodipine, Coreg, Imdur and lisinopril.  Blood pressure controlled however this morning while working with PT, she had orthostatic positive when she felt dizzy.  Clinically, she appears slightly dehydrated.  I will start her on normal saline 100 cc/h for 10 hours.  Monitor orthostatics every shift.  Observe overnight.  Continue thiamine supplement until B1 level is back.  Acute metabolic encephalopathy in a patient with dementia: Patient fully alert and oriented currently.  Encephalopathy resolved.  Continue Seroquel at night.  AKI on CKD stage IIIa: Baseline creatinine around 1.1 but her creatinine jumped to 1.6 yesterday.  Checking BMP today.  Hydrating.  Acquired hypothyroidism: Continue Synthroid.  DVT prophylaxis: heparin injection 5,000 Units Start: 06/14/23 1400   Code Status: Full Code  Family Communication: Granddaughter present at bedside.  Plan of care discussed with patient in length and he/she verbalized understanding and agreed with it.  Status is: Inpatient Remains inpatient appropriate because: Patient orthostatic positive and dizzy.   Estimated body mass index is 22.7 kg/m as calculated from the following:    Height as of this encounter: 5\' 2"  (1.575 m).   Weight as of this encounter: 56.3 kg.    Nutritional Assessment: Body mass index is 22.7 kg/m.Marland Kitchen Seen by dietician.  I agree with the assessment and plan as outlined below: Nutrition Status:        . Skin Assessment: I have examined the patient's skin and I agree with the wound assessment as performed by the wound care RN as outlined below:    Consultants:  Neurology and PCCM-signed off  Procedures:  None  Antimicrobials:  Anti-infectives (From admission, onward)    None         Subjective: Patient seen and examined.  Sitting at the edge of the bed and just finished working with PT.  Granddaughter at the bedside as well.  Patient has no complaints now.  Denies any chest pain, shortness of breath and she is fully alert and oriented.  Objective: Vitals:   06/15/23 2259 06/16/23 0500 06/16/23 0600 06/16/23 0807  BP: (!) 143/47 (!) 156/58  (!) 139/52  Pulse: 72 73  (!) 57  Resp:    18  Temp: 98.7 F (37.1 C) 99 F (37.2 C)  98 F (36.7 C)  TempSrc: Oral Oral    SpO2: 97% 97%  96%  Weight:  56.3 kg    Height:   5\' 2"  (1.575 m)     Intake/Output Summary (Last 24 hours) at 06/16/2023 0837 Last data filed at 06/15/2023 1500 Gross per 24 hour  Intake 480 ml  Output 300 ml  Net 180 ml   Filed Weights   06/14/23 0141 06/16/23 0500  Weight: 53.8 kg 56.3 kg    Examination:  General exam: Appears calm and comfortable  Respiratory system: Clear to auscultation. Respiratory effort normal. Cardiovascular system: S1 & S2 heard, RRR. No JVD, murmurs, rubs, gallops or clicks. No pedal edema. Gastrointestinal system: Abdomen is nondistended, soft and nontender. No organomegaly or masses felt. Normal bowel sounds heard. Central nervous system: Alert and oriented. No focal neurological deficits. Extremities: Symmetric 5 x 5 power. Skin: No rashes, lesions or ulcers   Data Reviewed: I have personally reviewed following  labs and imaging studies  CBC: Recent Labs  Lab 06/13/23 2118 2023/07/14 0902  WBC 10.2 8.9  NEUTROABS 7.5  --   HGB 13.1 12.4  HCT 38.0 37.0  MCV 91.3 91.1  PLT 231 224   Basic Metabolic Panel: Recent Labs  Lab 06/13/23 2256 14-Jul-2023 0902 06/15/23 0919  NA 137 136 134*  K 3.9 3.3* 4.5  CL 103 103 107  CO2 22 22 19*  GLUCOSE 134* 104* 142*  BUN 20 19 24*  CREATININE 1.15* 1.16* 1.69*  CALCIUM 10.8* 10.1 9.8  MG  --  1.9  --    GFR: Estimated Creatinine Clearance: 22 mL/min (A) (by C-G formula based on SCr of 1.69 mg/dL (H)). Liver Function Tests: Recent Labs  Lab 06/13/23 2256 July 14, 2023 0902  AST 19 17  ALT 17 14  ALKPHOS 50 44  BILITOT 0.7 1.0  PROT 7.0 6.5  ALBUMIN 4.4 4.0   No results for input(s): "LIPASE", "AMYLASE" in the last 168 hours. Recent Labs  Lab 07/14/23 0902  AMMONIA 17   Coagulation Profile: Recent Labs  Lab 06/13/23 2118  INR 1.0   Cardiac Enzymes: No results for input(s): "CKTOTAL", "CKMB", "CKMBINDEX", "TROPONINI" in the last 168 hours. BNP (last 3 results) No results for input(s): "PROBNP" in the last 8760 hours. HbA1C: No results for input(s): "HGBA1C" in the last 72 hours. CBG: Recent Labs  Lab 07/14/23 0406 2023/07/14 0757 07/14/2023 1142 07/14/2023 1928 06/15/23 0311  GLUCAP 120* 95 99 125* 94   Lipid Profile: No results for input(s): "CHOL", "HDL", "LDLCALC", "TRIG", "CHOLHDL", "LDLDIRECT" in the last 72 hours. Thyroid Function Tests: Recent Labs    2023/07/14 0901 07/14/23 0902  TSH  --  8.866*  FREET4 0.88  --    Anemia Panel: Recent Labs    07-14-2023 0901  VITAMINB12 1,044*   Sepsis Labs: No results for input(s): "PROCALCITON", "LATICACIDVEN" in the last 168 hours.  Recent Results (from the past 240 hour(s))  MRSA Next Gen by PCR, Nasal     Status: None   Collection Time: 07/14/2023  1:32 AM   Specimen: Nasal Mucosa; Nasal Swab  Result Value Ref Range Status   MRSA by PCR Next Gen NOT DETECTED NOT DETECTED  Final    Comment: (NOTE) The GeneXpert MRSA Assay (FDA approved for NASAL specimens only), is one component of a comprehensive MRSA colonization surveillance program. It is not intended to diagnose MRSA infection nor to guide or monitor treatment for MRSA infections. Test performance is not FDA approved in patients less than 53 years old. Performed at [facility], [address], [place], [place] [address]      Radiology Studies: EEG adult  Result Date: 07-14-2023 Charlsie Quest, MD     07-14-23  4:03 PM Patient Name: Tamara Bowen MRN: [id number] Epilepsy Attending: Charlsie Quest Referring Physician/Provider: Gordy Councilman, MD Date: 14-Jul-2023 Duration: 23.25 mins Patient history: 77 year old female presenting with encephalopathy in the setting of severe hypertension getting eeg to evaluate for seizure. Level of alertness: Awake AEDs during EEG  study: None Technical aspects: This EEG study was done with scalp electrodes positioned according to the 10-20 International system of electrode placement. Electrical activity was reviewed with band pass filter of 1-70Hz , sensitivity of 7 uV/mm, display speed of 35mm/sec with a 60Hz  notched filter applied as appropriate. EEG data were recorded continuously and digitally stored.  Video monitoring was available and reviewed as appropriate. Description: The posterior dominant rhythm consists of 8 Hz activity of moderate voltage (25-35 uV) seen predominantly in posterior head regions, symmetric and reactive to eye opening and eye closing. EEG showed continuous generalized 3 to 7 Hz theta-delta slowing. Hyperventilation and photic stimulation were not performed.   ABNORMALITY - Continuous slow, generalized IMPRESSION: This study is suggestive of mild to moderate diffuse encephalopathy. No seizures or epileptiform discharges were seen throughout the recording. Priyanka Annabelle Harman    Scheduled Meds:  amLODipine  10 mg Oral Daily   carvedilol  25 mg  Oral BID WC   Chlorhexidine Gluconate Cloth  6 each Topical Q0600   heparin  5,000 Units Subcutaneous Q8H   isosorbide mononitrate  120 mg Oral Daily   levothyroxine  88 mcg Oral Daily   lisinopril  10 mg Oral Daily   QUEtiapine  12.5 mg Oral QHS,MR X 1   Continuous Infusions:  sodium chloride     thiamine (VITAMIN B1) injection 500 mg (06/16/23 0527)     LOS: 2 days   Hughie Closs, MD [facility]  06/16/2023, 8:37 AM   *Please note that this is a verbal dictation therefore any spelling or grammatical errors are due to the "Dragon Medical One" system interpretation.  Please page via Amion and do not message via secure chat for urgent patient care matters. Secure chat can be used for non urgent patient care matters.  How to contact the [facility] Attending or Consulting provider 7A - 7P or covering provider during after hours 7P -7A, for this patient?  Check the care team in [facility] and look for a) attending/consulting TRH provider listed and b) the [facility] team listed. Page or secure chat 7A-7P. Log into [email or web address] and use [place]'s universal password to access. If you do not have the password, please contact the hospital operator. Locate the [facility] provider you are looking for under [facility] and page to a number that you can be directly reached. If you still have difficulty reaching the provider, please page the [facility] (Director on Call) for the Hospitalists listed on amion for assistance.

## 2023-06-17 DIAGNOSIS — I161 Hypertensive emergency: Secondary | ICD-10-CM | POA: Diagnosis not present

## 2023-06-17 LAB — BASIC METABOLIC PANEL
Anion gap: 7 (ref 5–15)
BUN: 22 mg/dL (ref 8–23)
CO2: 16 mmol/L — ABNORMAL LOW (ref 22–32)
Calcium: 8.1 mg/dL — ABNORMAL LOW (ref 8.9–10.3)
Chloride: 115 mmol/L — ABNORMAL HIGH (ref 98–111)
Creatinine, Ser: 1.45 mg/dL — ABNORMAL HIGH (ref 0.44–1.00)
GFR, Estimated: 37 mL/min — ABNORMAL LOW (ref >60)
Glucose, Bld: 97 mg/dL (ref 70–99)
Potassium: 3.7 mmol/L (ref 3.5–5.1)
Sodium: 138 mmol/L (ref 135–145)

## 2023-06-17 MED ORDER — CARVEDILOL 12.5 MG PO TABS
12.5000 mg | ORAL_TABLET | Freq: Two times a day (BID) | ORAL | Status: DC
  Administered 2023-06-17 – 2023-06-19 (×4): 12.5 mg via ORAL
  Filled 2023-06-17 (×4): qty 1

## 2023-06-17 MED ORDER — SODIUM CHLORIDE 0.9 % IV SOLN: INTRAVENOUS | Status: DC

## 2023-06-17 MED ORDER — ISOSORBIDE MONONITRATE ER 60 MG PO TB24
60.0000 mg | ORAL_TABLET | Freq: Every day | ORAL | Status: DC
  Administered 2023-06-18 – 2023-06-19 (×2): 60 mg via ORAL
  Filled 2023-06-17 (×2): qty 1

## 2023-06-17 MED ORDER — SODIUM CHLORIDE 0.9 % IV SOLN: INTRAVENOUS | Status: AC

## 2023-06-17 NOTE — Plan of Care (Signed)

## 2023-06-17 NOTE — Progress Notes (Signed)
PROGRESS NOTE    Tamara Bowen  NWG:[id number] DOB: 04/29/1946 DOA: 06/13/2023 PCP: Vernona Rieger, MD   Brief Narrative:  Patient is a 77 year old female with pertinent PMH of HTN,HLD,CAD,CKD 3a, hypothyroidism presented to [facility] ED on 9/8 with headaches and AMS. HTN of 254/76, started on cardene infusion and admitted to [facility] icu.    Hypertension improved and patient is on oral Amlodipine, Carvedilol, Imdur and Lisinopril.  Transferred under [facility] 06/16/2023.  Assessment & Plan:   Principal Problem:   Hypertensive emergency Active Problems:   Acute metabolic encephalopathy  Hypertensive urgency: Initially presented with blood pressure of 254/76.  Admitted under ICU, started on Cardene and transition to oral antihypertensives.  She was on amlodipine, Coreg, Imdur and lisinopril.  However lisinopril was stopped on 06/16/2023 due to AKI.  Patient currently on amlodipine 10, Coreg 25 twice daily and Imdur 120 mg p.o. daily.  Blood pressures very well-controlled and in fact diastolic in 50s and she is still having orthostatic positive vitals.  Will need to back off on some of those medications.  Will reduce Imdur to 60 and Coreg to 12.5 mg twice daily and continue current dose of amlodipine and recheck orthostatics later today and tomorrow morning.  Will hydrate her for another 10 hours.  Acute metabolic encephalopathy in a patient with dementia: Patient fully alert and oriented currently.  Encephalopathy resolved.  Continue Seroquel at night.  AKI on CKD stage IIIa: Baseline creatinine around 1.1 but her creatinine jumped to 1.8 on 06/16/2023 which improved to 1.4 today.  Continue IV fluids and recheck labs in the morning.  Acquired hypothyroidism: Continue Synthroid.  DVT prophylaxis: heparin injection 5,000 Units Start: 06/14/23 1400   Code Status: Full Code  Family Communication: Granddaughter present at bedside.  Plan of care discussed with patient in length and he/she  verbalized understanding and agreed with it.  Status is: Inpatient Remains inpatient appropriate because: Patient still orthostatic positive but she was not dizzy today.  Needs IV fluids and observation and adjustment of medications.   Estimated body mass index is 22.7 kg/m as calculated from the following:   Height as of this encounter: 5\' 2"  (1.575 m).   Weight as of this encounter: 56.3 kg.    Nutritional Assessment: Body mass index is 22.7 kg/m.Marland Kitchen Seen by dietician.  I agree with the assessment and plan as outlined below: Nutrition Status:        . Skin Assessment: I have examined the patient's skin and I agree with the wound assessment as performed by the wound care RN as outlined below:    Consultants:  Neurology and PCCM-signed off  Procedures:  None  Antimicrobials:  Anti-infectives (From admission, onward)    None         Subjective: Patient seen and examined.  She is doing well.  Has no complaints.  Granddaughter at the bedside.  Objective: Vitals:   06/17/23 0458 06/17/23 0721 06/17/23 1015 06/17/23 1026  BP: (!) 129/42 (!) 135/52  (!) 123/52  Pulse: 62 65  63  Resp: 16 16    Temp: 98.2 F (36.8 C) 98 F (36.7 C)    TempSrc: Oral     SpO2: 98% 99% 100%   Weight:      Height:        Intake/Output Summary (Last 24 hours) at 06/17/2023 1039 Last data filed at 06/16/2023 2231 Gross per 24 hour  Intake 400 ml  Output --  Net 400 ml  Filed Weights   06/14/23 0141 06/16/23 0500  Weight: 53.8 kg 56.3 kg    Examination:  General exam: Appears calm and comfortable  Respiratory system: Clear to auscultation. Respiratory effort normal. Cardiovascular system: S1 & S2 heard, RRR. No JVD, murmurs, rubs, gallops or clicks. No pedal edema. Gastrointestinal system: Abdomen is nondistended, soft and nontender. No organomegaly or masses felt. Normal bowel sounds heard. Central nervous system: Alert and oriented. No focal neurological  deficits. Extremities: Symmetric 5 x 5 power. Skin: No rashes, lesions or ulcers.  Psychiatry: Judgement and insight appear normal. Mood & affect appropriate.   Data Reviewed: I have personally reviewed following labs and imaging studies  CBC: Recent Labs  Lab 06/13/23 2118 06/14/23 0902  WBC 10.2 8.9  NEUTROABS 7.5  --   HGB 13.1 12.4  HCT 38.0 37.0  MCV 91.3 91.1  PLT 231 224   Basic Metabolic Panel: Recent Labs  Lab 06/13/23 2256 06/14/23 0902 06/15/23 0919 06/16/23 0812 06/17/23 0609  NA 137 136 134* 134* 138  K 3.9 3.3* 4.5 4.6 3.7  CL 103 103 107 107 115*  CO2 22 22 19* 19* 16*  GLUCOSE 134* 104* 142* 112* 97  BUN 20 19 24* 25* 22  CREATININE 1.15* 1.16* 1.69* 1.83* 1.45*  CALCIUM 10.8* 10.1 9.8 9.5 8.1*  MG  --  1.9  --   --   --    GFR: Estimated Creatinine Clearance: 25.7 mL/min (A) (by C-G formula based on SCr of 1.45 mg/dL (H)). Liver Function Tests: Recent Labs  Lab 06/13/23 2256 06/14/23 0902  AST 19 17  ALT 17 14  ALKPHOS 50 44  BILITOT 0.7 1.0  PROT 7.0 6.5  ALBUMIN 4.4 4.0   No results for input(s): "LIPASE", "AMYLASE" in the last 168 hours. Recent Labs  Lab 06/14/23 0902  AMMONIA 17   Coagulation Profile: Recent Labs  Lab 06/13/23 2118  INR 1.0   Cardiac Enzymes: No results for input(s): "CKTOTAL", "CKMB", "CKMBINDEX", "TROPONINI" in the last 168 hours. BNP (last 3 results) No results for input(s): "PROBNP" in the last 8760 hours. HbA1C: No results for input(s): "HGBA1C" in the last 72 hours. CBG: Recent Labs  Lab 06/14/23 0406 06/14/23 0757 06/14/23 1142 06/14/23 1928 06/15/23 0311  GLUCAP 120* 95 99 125* 94   Lipid Profile: No results for input(s): "CHOL", "HDL", "LDLCALC", "TRIG", "CHOLHDL", "LDLDIRECT" in the last 72 hours. Thyroid Function Tests: No results for input(s): "TSH", "T4TOTAL", "FREET4", "T3FREE", "THYROIDAB" in the last 72 hours.  Anemia Panel: No results for input(s): "VITAMINB12", "FOLATE",  "FERRITIN", "TIBC", "IRON", "RETICCTPCT" in the last 72 hours.  Sepsis Labs: No results for input(s): "PROCALCITON", "LATICACIDVEN" in the last 168 hours.  Recent Results (from the past 240 hour(s))  MRSA Next Gen by PCR, Nasal     Status: None   Collection Time: 06/14/23  1:32 AM   Specimen: Nasal Mucosa; Nasal Swab  Result Value Ref Range Status   MRSA by PCR Next Gen NOT DETECTED NOT DETECTED Final    Comment: (NOTE) The GeneXpert MRSA Assay (FDA approved for NASAL specimens only), is one component of a comprehensive MRSA colonization surveillance program. It is not intended to diagnose MRSA infection nor to guide or monitor treatment for MRSA infections. Test performance is not FDA approved in patients less than 78 years old. Performed at [facility], [address], [place], [place] [address]      Radiology Studies: No results found.  Scheduled Meds:  amLODipine  10 mg Oral Daily   carvedilol  12.5 mg Oral BID WC   Chlorhexidine Gluconate Cloth  6 each Topical Q0600   heparin  5,000 Units Subcutaneous Q8H   [START ON 06/18/2023] isosorbide mononitrate  60 mg Oral Daily   levothyroxine  88 mcg Oral Daily   QUEtiapine  12.5 mg Oral QHS,MR X 1   Continuous Infusions:  sodium chloride     thiamine (VITAMIN B1) injection 500 mg (06/17/23 0554)     LOS: 3 days   Hughie Closs, MD [facility]  06/17/2023, 10:39 AM   *Please note that this is a verbal dictation therefore any spelling or grammatical errors are due to the "Dragon Medical One" system interpretation.  Please page via Amion and do not message via secure chat for urgent patient care matters. Secure chat can be used for non urgent patient care matters.  How to contact the [facility] Attending or Consulting provider 7A - 7P or covering provider during after hours 7P -7A, for this patient?  Check the care team in [facility] and look for a) attending/consulting TRH provider listed and b) the [facility] team listed. Page  or secure chat 7A-7P. Log into [email or web address] and use [place]'s universal password to access. If you do not have the password, please contact the hospital operator. Locate the [facility] provider you are looking for under [facility] and page to a number that you can be directly reached. If you still have difficulty reaching the provider, please page the [facility] (Director on Call) for the Hospitalists listed on amion for assistance.

## 2023-06-17 NOTE — Care Management Important Message (Signed)
Important Message  Patient Details  Name: Tamara Bowen MRN: [id number] Date of Birth: Oct 22, 1945   Medicare Important Message Given:  Yes     Dorena Bodo 06/17/2023, 2:15 PM

## 2023-06-18 DIAGNOSIS — I161 Hypertensive emergency: Secondary | ICD-10-CM | POA: Diagnosis not present

## 2023-06-18 LAB — BASIC METABOLIC PANEL
Anion gap: 7 (ref 5–15)
BUN: 17 mg/dL (ref 8–23)
CO2: 19 mmol/L — ABNORMAL LOW (ref 22–32)
Calcium: 8.8 mg/dL — ABNORMAL LOW (ref 8.9–10.3)
Chloride: 113 mmol/L — ABNORMAL HIGH (ref 98–111)
Creatinine, Ser: 1.18 mg/dL — ABNORMAL HIGH (ref 0.44–1.00)
GFR, Estimated: 48 mL/min — ABNORMAL LOW (ref >60)
Glucose, Bld: 108 mg/dL — ABNORMAL HIGH (ref 70–99)
Potassium: 3.8 mmol/L (ref 3.5–5.1)
Sodium: 139 mmol/L (ref 135–145)

## 2023-06-18 LAB — VITAMIN B1: Vitamin B1 (Thiamine): 122.9 nmol/L (ref 66.5–200.0)

## 2023-06-18 MED ORDER — SODIUM CHLORIDE 0.9 % IV SOLN: INTRAVENOUS | Status: AC

## 2023-06-18 NOTE — Progress Notes (Signed)
Occupational Therapy Treatment Patient Details Name: Tamara Bowen MRN: [id number] DOB: 1946/01/08 Today's Date: 06/18/2023   History of present illness 77 yo admitted 9/8 with HA and AMS. 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 EEG with encephalopathy. PMhx; cardiac arrest, HTN, HLD, CAD, CKD   OT comments  Pt progressing toward established OT goals. Challenging cognition including memory, problem solving, organization, and following commands with series of 3 step commands at 3 different times during session. Pt able to perform with min indirect cues on first attempt during ADL and needing no cues for two additional attempts to follow other commands (I.e. give daughter high five, walk to door, clap 3x, etc). Will continue to follow. Educated daughter regarding safety/supervision provision during medication management when first returning home.       If plan is discharge home, recommend the following:  Assistance with cooking/housework;Direct supervision/assist for medications management;Direct supervision/assist for financial management;Help with stairs or ramp for entrance;Assist for transportation   Equipment Recommendations  None recommended by OT    Recommendations for Other Services      Precautions / Restrictions Precautions Precautions: Fall Restrictions Weight Bearing Restrictions: No       Mobility Bed Mobility Overal bed mobility: Modified Independent                  Transfers Overall transfer level: Needs assistance   Transfers: Sit to/from Stand, Bed to chair/wheelchair/BSC Sit to Stand: Supervision           General transfer comment: for safety     Balance                                           ADL either performed or assessed with clinical judgement   ADL Overall ADL's : Needs assistance/impaired     Grooming: Wash/dry hands;Oral care;Brushing hair;Supervision/safety;Standing                    Toilet Transfer: Supervision/safety;Ambulation   Toileting- Clothing Manipulation and Hygiene: Supervision/safety;Sitting/lateral lean Toileting - Clothing Manipulation Details (indicate cue type and reason): pericare and underwear management     Functional mobility during ADLs: Supervision/safety;Contact guard assist      Extremity/Trunk Assessment Upper Extremity Assessment Upper Extremity Assessment: Generalized weakness   Lower Extremity Assessment Lower Extremity Assessment: Generalized weakness        Vision   Vision Assessment?: No apparent visual deficits   Perception     Praxis      Cognition Arousal: Alert Behavior During Therapy: WFL for tasks assessed/performed Overall Cognitive Status: Impaired/Different from baseline Area of Impairment: Memory, Following commands, Safety/judgement, Awareness, Problem solving, Attention                   Current Attention Level: Sustained, Selective (able to receive directions while daughter in room speaking on phone) Memory: Decreased short-term memory Following Commands: Follows one step commands consistently, Follows multi-step commands inconsistently, Follows one step commands with increased time Safety/Judgement: Decreased awareness of safety, Decreased awareness of deficits Awareness: Emergent Problem Solving: Slow processing General Comments: Following three step commands initially with min indirect cues progressing to no cues this session for 3 differing tasks consisting of three separate 1-3 minute tasks. (brush teeth, wash hands, comb hair, etc)        Exercises      Shoulder Instructions       General  Comments cognition much improved this session. providing education to daughter that OT highly recommends supervision with medication management until cognition improves or pt has learned compensatory strategies and able to self implement    Pertinent Vitals/ Pain       Pain Assessment Pain  Assessment: No/denies pain  Home Living                                          Prior Functioning/Environment              Frequency  Min 2X/week        Progress Toward Goals  OT Goals(current goals can now be found in the care plan section)  Progress towards OT goals: Progressing toward goals  Acute Rehab OT Goals Patient Stated Goal: get better OT Goal Formulation: With patient Time For Goal Achievement: 06/30/23 Potential to Achieve Goals: Good ADL Goals Pt Will Transfer to Toilet: with modified independence;ambulating;regular height toilet Additional ADL Goal #1: Pt will follow two step commands with increased time and no cues for memory Additional ADL Goal #2: Pt will perform 10+ mins OOB activity without need for seated rest break Additional ADL Goal #3: Pt will perform pillbox test with min cues as precursor to return to medication management.  Plan      Co-evaluation                 AM-PAC OT "6 Clicks" Daily Activity     Outcome Measure   Help from another person eating meals?: None Help from another person taking care of personal grooming?: A Little Help from another person toileting, which includes using toliet, bedpan, or urinal?: A Little Help from another person bathing (including washing, rinsing, drying)?: A Little Help from another person to put on and taking off regular upper body clothing?: A Little Help from another person to put on and taking off regular lower body clothing?: A Little 6 Click Score: 19    End of Session    OT Visit Diagnosis: Unsteadiness on feet (R26.81);Muscle weakness (generalized) (M62.81);Other symptoms and signs involving cognitive function   Activity Tolerance Patient tolerated treatment well   Patient Left in bed;with call bell/phone within reach;with family/visitor present;with bed alarm set   Nurse Communication Mobility status        Time: 4098-1191 OT Time Calculation (min): 21  min  Charges: OT General Charges $OT Visit: 1 Visit OT Treatments $Self Care/Home Management : 8-22 mins  Tamara Bowen, OTR/L [facility] Acute Rehabilitation Office: [phone]   Tamara Bowen 06/18/2023, 5:19 PM

## 2023-06-18 NOTE — Progress Notes (Signed)
PROGRESS NOTE    Tamara Bowen  ZHY:[id number] DOB: 1946-01-18 DOA: 06/13/2023 PCP: Vernona Rieger, MD   Brief Narrative:  Patient is a 77 year old female with pertinent PMH of HTN,HLD,CAD,CKD 3a, hypothyroidism presented to [facility] ED on 9/8 with headaches and AMS. HTN of 254/76, started on cardene infusion and admitted to [facility] icu.    Hypertension improved and patient is on oral Amlodipine, Carvedilol, Imdur and Lisinopril.  Transferred under [facility] 06/16/2023.  Assessment & Plan:   Principal Problem:   Hypertensive emergency Active Problems:   Acute metabolic encephalopathy  Hypertensive urgency: Initially presented with blood pressure of 254/76.  Admitted under ICU, started on Cardene and transition to oral antihypertensives.  She was on amlodipine, Coreg, Imdur and lisinopril.  However lisinopril was stopped on 06/16/2023 due to AKI.  Patient currently on amlodipine 10, Coreg 25 twice daily and Imdur 120 mg p.o. daily.  She has been noted to have orthostatic hypotension since last 2 days, I have backed off on some of the medications, reduced Imdur to 60 and Coreg to 12.5 mg p.o. twice daily.  Systolic blood pressure dropped 60 mmHg this morning but she is not symptomatic anymore.  Her granddaughter, daughter at the bedside and her, all of them are insisting to discharge but after lengthy discussion and the fact that this is a significant orthostatic hypotension, per my recommendation, they are willing to stay in the hospital and allow me to adjust medications.  We are going to check her orthostatics every 4 hours for 2 more times today.  I will give her 1 L of fluid over 10 hours again today.  Acute metabolic encephalopathy in a patient with dementia: Patient fully alert and oriented currently.  Encephalopathy resolved.  Continue Seroquel at night.  AKI on CKD stage IIIa: Baseline creatinine around 1.1 but her creatinine jumped to 1.8 on 06/16/2023 which improved to 1.2.  Resume  IV fluids.  Acquired hypothyroidism: Continue Synthroid.  DVT prophylaxis: heparin injection 5,000 Units Start: 06/14/23 1400   Code Status: Full Code  Family Communication: Granddaughter and daughter present at bedside.  Plan of care discussed with patient in length and he/she verbalized understanding and agreed with it.  Status is: Inpatient Remains inpatient appropriate because: Patient still orthostatic positive but she was not dizzy today.  Needs IV fluids and observation and adjustment of medications.   Estimated body mass index is 21.9 kg/m as calculated from the following:   Height as of this encounter: 5\' 2"  (1.575 m).   Weight as of this encounter: 54.3 kg.    Nutritional Assessment: Body mass index is 21.9 kg/m.Marland Kitchen Seen by dietician.  I agree with the assessment and plan as outlined below: Nutrition Status:        . Skin Assessment: I have examined the patient's skin and I agree with the wound assessment as performed by the wound care RN as outlined below:    Consultants:  Neurology and PCCM-signed off  Procedures:  None  Antimicrobials:  Anti-infectives (From admission, onward)    None         Subjective: Patient seen and examined.  She has no complaints.  Objective: Vitals:   06/18/23 0432 06/18/23 0434 06/18/23 0500 06/18/23 0729  BP: (!) 108/56 (!) 134/53  (!) 176/52  Pulse: 62 60  62  Resp: 18   17  Temp: 98.4 F (36.9 C)   98.2 F (36.8 C)  TempSrc: Oral   Oral  SpO2: 98% 99%  98%  Weight:   54.3 kg   Height:       No intake or output data in the 24 hours ending 06/18/23 1138  Filed Weights   06/16/23 0500 06/17/23 0710 06/18/23 0500  Weight: 56.3 kg 55.8 kg 54.3 kg    Examination:  General exam: Appears calm and comfortable  Respiratory system: Clear to auscultation. Respiratory effort normal. Cardiovascular system: S1 & S2 heard, RRR. No JVD, murmurs, rubs, gallops or clicks. No pedal edema. Gastrointestinal system: Abdomen  is nondistended, soft and nontender. No organomegaly or masses felt. Normal bowel sounds heard. Central nervous system: Alert and oriented. No focal neurological deficits. Extremities: Symmetric 5 x 5 power. Skin: No rashes, lesions or ulcers.  Psychiatry: Judgement and insight appear normal. Mood & affect appropriate.    Data Reviewed: I have personally reviewed following labs and imaging studies  CBC: Recent Labs  Lab 06/13/23 2118 06/14/23 0902  WBC 10.2 8.9  NEUTROABS 7.5  --   HGB 13.1 12.4  HCT 38.0 37.0  MCV 91.3 91.1  PLT 231 224   Basic Metabolic Panel: Recent Labs  Lab 06/14/23 0902 06/15/23 0919 06/16/23 0812 06/17/23 0609 06/18/23 0539  NA 136 134* 134* 138 139  K 3.3* 4.5 4.6 3.7 3.8  CL 103 107 107 115* 113*  CO2 22 19* 19* 16* 19*  GLUCOSE 104* 142* 112* 97 108*  BUN 19 24* 25* 22 17  CREATININE 1.16* 1.69* 1.83* 1.45* 1.18*  CALCIUM 10.1 9.8 9.5 8.1* 8.8*  MG 1.9  --   --   --   --    GFR: Estimated Creatinine Clearance: 31.6 mL/min (A) (by C-G formula based on SCr of 1.18 mg/dL (H)). Liver Function Tests: Recent Labs  Lab 06/13/23 2256 06/14/23 0902  AST 19 17  ALT 17 14  ALKPHOS 50 44  BILITOT 0.7 1.0  PROT 7.0 6.5  ALBUMIN 4.4 4.0   No results for input(s): "LIPASE", "AMYLASE" in the last 168 hours. Recent Labs  Lab 06/14/23 0902  AMMONIA 17   Coagulation Profile: Recent Labs  Lab 06/13/23 2118  INR 1.0   Cardiac Enzymes: No results for input(s): "CKTOTAL", "CKMB", "CKMBINDEX", "TROPONINI" in the last 168 hours. BNP (last 3 results) No results for input(s): "PROBNP" in the last 8760 hours. HbA1C: No results for input(s): "HGBA1C" in the last 72 hours. CBG: Recent Labs  Lab 06/14/23 0406 06/14/23 0757 06/14/23 1142 06/14/23 1928 06/15/23 0311  GLUCAP 120* 95 99 125* 94   Lipid Profile: No results for input(s): "CHOL", "HDL", "LDLCALC", "TRIG", "CHOLHDL", "LDLDIRECT" in the last 72 hours. Thyroid Function Tests: No  results for input(s): "TSH", "T4TOTAL", "FREET4", "T3FREE", "THYROIDAB" in the last 72 hours.  Anemia Panel: No results for input(s): "VITAMINB12", "FOLATE", "FERRITIN", "TIBC", "IRON", "RETICCTPCT" in the last 72 hours.  Sepsis Labs: No results for input(s): "PROCALCITON", "LATICACIDVEN" in the last 168 hours.  Recent Results (from the past 240 hour(s))  MRSA Next Gen by PCR, Nasal     Status: None   Collection Time: 06/14/23  1:32 AM   Specimen: Nasal Mucosa; Nasal Swab  Result Value Ref Range Status   MRSA by PCR Next Gen NOT DETECTED NOT DETECTED Final    Comment: (NOTE) The GeneXpert MRSA Assay (FDA approved for NASAL specimens only), is one component of a comprehensive MRSA colonization surveillance program. It is not intended to diagnose MRSA infection nor to guide or monitor treatment for MRSA infections. Test performance is not FDA approved in  patients less than 76 years old. Performed at [facility], [address] [place], [place] [address]      Radiology Studies: No results found.  Scheduled Meds:  amLODipine  10 mg Oral Daily   carvedilol  12.5 mg Oral BID WC   Chlorhexidine Gluconate Cloth  6 each Topical Q0600   heparin  5,000 Units Subcutaneous Q8H   isosorbide mononitrate  60 mg Oral Daily   levothyroxine  88 mcg Oral Daily   QUEtiapine  12.5 mg Oral QHS,MR X 1   Continuous Infusions:     LOS: 4 days   Hughie Closs, MD [facility]  06/18/2023, 11:38 AM   *Please note that this is a verbal dictation therefore any spelling or grammatical errors are due to the "Dragon [employer] One" system interpretation.  Please page via Amion and do not message via secure chat for urgent patient care matters. Secure chat can be used for non urgent patient care matters.  How to contact the [facility] Attending or Consulting provider 7A - 7P or covering provider during after hours 7P -7A, for this patient?  Check the care team in [facility] and look for a)  attending/consulting [facility] provider listed and b) the [facility] team listed. Page or secure chat 7A-7P. Log into [email or web address] and use [place]'s universal password to access. If you do not have the password, please contact the hospital operator. Locate the [facility] provider you are looking for under [facility] and page to a number that you can be directly reached. If you still have difficulty reaching the provider, please page the [facility] (Director on Call) for the Hospitalists listed on amion for assistance.

## 2023-06-18 NOTE — Plan of Care (Signed)

## 2023-06-19 DIAGNOSIS — I161 Hypertensive emergency: Secondary | ICD-10-CM | POA: Diagnosis not present

## 2023-06-19 LAB — BASIC METABOLIC PANEL
Anion gap: 9 (ref 5–15)
BUN: 11 mg/dL (ref 8–23)
CO2: 18 mmol/L — ABNORMAL LOW (ref 22–32)
Calcium: 8.6 mg/dL — ABNORMAL LOW (ref 8.9–10.3)
Chloride: 112 mmol/L — ABNORMAL HIGH (ref 98–111)
Creatinine, Ser: 1.07 mg/dL — ABNORMAL HIGH (ref 0.44–1.00)
GFR, Estimated: 53 mL/min — ABNORMAL LOW (ref >60)
Glucose, Bld: 102 mg/dL — ABNORMAL HIGH (ref 70–99)
Potassium: 3.6 mmol/L (ref 3.5–5.1)
Sodium: 139 mmol/L (ref 135–145)

## 2023-06-19 MED ORDER — AMLODIPINE BESYLATE 10 MG PO TABS: 10.0000 mg | ORAL_TABLET | Freq: Every day | ORAL | 0 refills | Status: AC

## 2023-06-19 MED ORDER — CARVEDILOL 12.5 MG PO TABS: 12.5000 mg | ORAL_TABLET | Freq: Two times a day (BID) | ORAL | 0 refills | Status: AC

## 2023-06-19 MED ORDER — ISOSORBIDE MONONITRATE ER 60 MG PO TB24: 60.0000 mg | ORAL_TABLET | Freq: Every day | ORAL | 0 refills | Status: AC

## 2023-06-19 NOTE — Plan of Care (Signed)

## 2023-06-19 NOTE — Progress Notes (Signed)
Occupational Therapy Treatment Patient Details Name: Tamara Bowen MRN: [id number] DOB: April 04, 1946 Today's Date: 06/19/2023   History of present illness 77 yo admitted 9/8 with HA and AMS. 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 EEG with encephalopathy. PMhx; cardiac arrest, HTN, HLD, CAD, CKD   OT comments  Pt at this time completed pill test as needed assistance with placing two "pills" into med box and went over 5 mins to complete this amount at this time. It was discussed with the family and patient to have assist with medication management. Pt also educated about positional changes with ADLS due to BP readings.   Pt's BP was also taken in the session and see bellow but no reports of dizziness: supine with HOB elevated 180/64, sitting 144/57, standing 99/57 and standing 3 mins 99/55      If plan is discharge home, recommend the following:  Assistance with cooking/housework;Direct supervision/assist for medications management;Direct supervision/assist for financial management;Help with stairs or ramp for entrance;Assist for transportation   Equipment Recommendations  None recommended by OT    Recommendations for Other Services      Precautions / Restrictions Precautions Precautions: Fall Precaution Comments: watch orthostatics Restrictions Weight Bearing Restrictions: No       Mobility Bed Mobility Overal bed mobility: Modified Independent Bed Mobility: Supine to Sit, Sit to Supine     Supine to sit: Supervision, Modified independent (Device/Increase time) Sit to supine: Modified independent (Device/Increase time)        Transfers Overall transfer level: Needs assistance   Transfers: Sit to/from Stand Sit to Stand: Supervision                 Balance                                           ADL either performed or assessed with clinical judgement   ADL Overall ADL's : Needs assistance/impaired Eating/Feeding:  Independent   Grooming: Wash/dry hands;Wash/dry face;Sitting;Modified independent   Upper Body Bathing: Modified independent;Sitting   Lower Body Bathing: Supervison/ safety;Sit to/from stand   Upper Body Dressing : Modified independent;Sitting   Lower Body Dressing: Supervision/safety;Sit to/from stand   Toilet Transfer: Supervision/safety   Toileting- Architect and Hygiene: Supervision/safety;Cueing for safety;Cueing for sequencing;Sit to/from stand              Extremity/Trunk Assessment Upper Extremity Assessment Upper Extremity Assessment: Generalized weakness   Lower Extremity Assessment Lower Extremity Assessment: Defer to PT evaluation        Vision   Vision Assessment?: No apparent visual deficits;Wears glasses for reading   Perception     Praxis      Cognition Arousal: Alert Behavior During Therapy: WFL for tasks assessed/performed Overall Cognitive Status: Impaired/Different from baseline Area of Impairment: Memory, Following commands, Safety/judgement, Awareness, Problem solving, Attention                   Current Attention Level: Sustained, Selective Memory: Decreased short-term memory Following Commands: Follows one step commands consistently, Follows multi-step commands inconsistently, Follows one step commands with increased time Safety/Judgement: Decreased awareness of safety, Decreased awareness of deficits Awareness: Emergent Problem Solving: Slow processing General Comments: Pt needed repeated written and verabl instructions        Exercises      Shoulder Instructions       General Comments Pt completed pill box and failed  test as took over 5 mins to complete two pills set up and required multiple cues on how to complete. Pt's family was advised to assist in completion and no further driving unless md say otherwise. Pt's orthostatics were completed at this time. Pt's BP at supine with HOB elevated 180/64, sitting  144/57, standing 99/57 and standing 3 mins 99/55.    Pertinent Vitals/ Pain       Pain Assessment Pain Assessment: No/denies pain  Home Living                                          Prior Functioning/Environment              Frequency  Min 2X/week        Progress Toward Goals  OT Goals(current goals can now be found in the care plan section)  Progress towards OT goals: Progressing toward goals  Acute Rehab OT Goals Patient Stated Goal: to go home with family OT Goal Formulation: With patient Time For Goal Achievement: 06/30/23 Potential to Achieve Goals: Good ADL Goals Pt Will Transfer to Toilet: with modified independence;ambulating;regular height toilet Additional ADL Goal #1: Pt will follow two step commands with increased time and no cues for memory Additional ADL Goal #2: Pt will perform 10+ mins OOB activity without need for seated rest break Additional ADL Goal #3: Pt will perform pillbox test with min cues as precursor to return to medication management.  Plan      Co-evaluation                 AM-PAC OT "6 Clicks" Daily Activity     Outcome Measure   Help from another person eating meals?: None Help from another person taking care of personal grooming?: A Little Help from another person toileting, which includes using toliet, bedpan, or urinal?: A Little Help from another person bathing (including washing, rinsing, drying)?: A Little Help from another person to put on and taking off regular upper body clothing?: A Little Help from another person to put on and taking off regular lower body clothing?: A Little 6 Click Score: 19    End of Session Equipment Utilized During Treatment: Gait belt  OT Visit Diagnosis: Unsteadiness on feet (R26.81);Muscle weakness (generalized) (M62.81);Other symptoms and signs involving cognitive function   Activity Tolerance Patient tolerated treatment well   Patient Left in bed;with call  bell/phone within reach;with family/visitor present   Nurse Communication Mobility status        Time: 2130-8657 OT Time Calculation (min): 42 min  Charges: OT General Charges $OT Visit: 1 Visit OT Treatments $Self Care/Home Management : 38-52 mins  Tamara Bowen OTR/L  Acute Rehab Services  [phone] office number   Tamara Bowen 06/19/2023, 10:03 AM

## 2023-06-19 NOTE — Discharge Summary (Signed)
Physician Discharge Summary  Tamara Bowen ZOX:[id number] DOB: August 06, 1946 DOA: 06/13/2023  PCP: Vernona Rieger, MD  Admit date: 06/13/2023 Discharge date: 06/19/2023 30 Day Unplanned Readmission Risk Score    Flowsheet Row ED to Hosp-Admission (Current) from 06/13/2023 in [place] [facility]  30 Day Unplanned Readmission Risk Score (%) 14.12 Filed at 06/19/2023 0800       This score is the patient's risk of an unplanned readmission within 30 days of being discharged (0 -100%). The score is based on dignosis, age, lab data, medications, orders, and past utilization.   Low:  0-14.9   Medium: 15-21.9   High: 22-29.9   Extreme: 30 and above          Admitted From: Home Disposition: Home  Recommendations for Outpatient Follow-up:  Follow up with PCP, per granddaughter, she has a scheduled appointment on Monday, 06/21/2023. Please obtain BMP/CBC in one week Please follow up with your PCP on the following pending results: Unresulted Labs (From admission, onward)     Start     Ordered   06/16/23 0500  Basic metabolic panel  Daily,   R     Question:  Specimen collection method  Answer:  Lab=Lab collect   06/15/23 1048   06/14/23 0156  Rapid urine drug screen (hospital performed)  ONCE - STAT,   STAT        06/14/23 0155              Home Health: None Equipment/Devices: None  Discharge Condition: Stable CODE STATUS: Full code Diet recommendation: Cardiac  Subjective: Seen and examined.  Granddaughter at the bedside.  Patient has remained asymptomatic for last 2 days and she is very eager to go home today and insisting that I discharged her today.  Brief/Interim Summary: Patient is a 77 year old female with pertinent PMH of HTN,HLD,CAD,CKD 3a, hypothyroidism presented to [facility] ED on 9/8 with headaches and AMS. HTN of 254/76, started on cardene infusion and admitted to [facility] icu under PCCM team with the diagnosis of hypertensive urgency. Hypertension  improved and patient resumed on oral Amlodipine, Carvedilol, Imdur and Lisinopril.  Transferred under [facility] 06/16/2023. She was on amlodipine, Coreg, Imdur and lisinopril.  However lisinopril was stopped on 06/16/2023 due to AKI.  Patient then was on amlodipine 10, Coreg 25 twice daily and Imdur 120 mg p.o. daily.  She was found to have orthostatic hypotension, initially she was dizzy with orthostatic hypotension, I reduced her Coreg to 12.5 mg twice daily and reduce her Imdur to 60 mg daily, despite of this, she continued to have orthostatic hypotension but she was asymptomatic and this has been ongoing for 2 days.  Patient was given IV fluids 1 L each day for last 2 days.  This improved her AKI.  Patient has been insistent to let her go since yesterday.  I told her today that although I do not feel comfortable discharging her due to orthostatic hypotension with a drop of 50 points in systolic however she is very insistent and the fact that she will have 24/7 supervision by granddaughter for at least next 3 days and the fact that reassured me that she will do all the measures to prevent the falls at home, I am discharging her per her insistence.  Other than that, she also had brief.  Self delirium during this hospitalization which was treated with delirium precautions and Seroquel and for past 3 days, patient has remained alert and oriented.  AKI on CKD stage IIIa: Baseline creatinine around 1.1 but her creatinine jumped to 1.8 on 06/16/2023 which improved to 1.1.     Acquired hypothyroidism: Continue Synthroid.  Discharge plan was discussed with patient and/or family member/granddaughter and they verbalized understanding and agreed with it.  Discharge Diagnoses:  Principal Problem:   Hypertensive emergency Active Problems:   Acute metabolic encephalopathy    Discharge Instructions  Discharge Instructions     Ambulatory referral to Neurology   Complete by: As directed    An appointment is requested  in approximately: 4-8 weeks      Allergies as of 06/19/2023   No Known Allergies      Medication List     STOP taking these medications    hydrALAZINE 50 MG tablet Commonly known as: APRESOLINE       TAKE these medications    amLODipine 10 MG tablet Commonly known as: NORVASC Take 1 tablet (10 mg total) by mouth daily. Start taking on: June 20, 2023 What changed:  medication strength how much to take   aspirin EC 81 MG tablet Take 81 mg by mouth daily.   atorvastatin 40 MG tablet Commonly known as: LIPITOR Take 40 mg by mouth daily.   carvedilol 12.5 MG tablet Commonly known as: COREG Take 1 tablet (12.5 mg total) by mouth 2 (two) times daily with a meal. What changed:  medication strength how much to take when to take this   Cholecalciferol 25 MCG (1000 UT) capsule Take 1,000 Units by mouth daily.   clopidogrel 75 MG tablet Commonly known as: PLAVIX Take 75 mg by mouth daily.   famotidine 20 MG tablet Commonly known as: PEPCID TAKE 2 TABLETS BY MOUTH EVERY EVENING   isosorbide mononitrate 60 MG 24 hr tablet Commonly known as: IMDUR Take 1 tablet (60 mg total) by mouth daily. Start taking on: June 20, 2023 What changed:  medication strength how much to take   levothyroxine 88 MCG tablet Commonly known as: SYNTHROID Take 88 mcg by mouth daily.   topiramate 50 MG tablet Commonly known as: TOPAMAX Take 50 mg by mouth daily.        Follow-up Information     Vernona Rieger, MD Follow up in 1 week(s).   Specialty: Internal Medicine Contact information: [address] [place] [address] [phone]                No Known Allergies  Consultations: None   Procedures/Studies: EEG adult  Result Date: 07/12/23 Charlsie Quest, MD     12-Jul-2023  4:03 PM Patient Name: Tamara Bowen MRN: [id number] Epilepsy Attending: Charlsie Quest Referring Physician/Provider: Gordy Councilman, MD Date: Jul 12, 2023 Duration:  23.25 mins Patient history: 77 year old female presenting with encephalopathy in the setting of severe hypertension getting eeg to evaluate for seizure. Level of alertness: Awake AEDs during EEG study: None Technical aspects: This EEG study was done with scalp electrodes positioned according to the 10-20 International system of electrode placement. Electrical activity was reviewed with band pass filter of 1-70Hz , sensitivity of 7 uV/mm, display speed of 20mm/sec with a 60Hz  notched filter applied as appropriate. EEG data were recorded continuously and digitally stored.  Video monitoring was available and reviewed as appropriate. Description: The posterior dominant rhythm consists of 8 Hz activity of moderate voltage (25-35 uV) seen predominantly in posterior head regions, symmetric and reactive to eye opening and eye closing. EEG showed continuous generalized 3 to 7 Hz theta-delta slowing. Hyperventilation and photic  stimulation were not performed.   ABNORMALITY - Continuous slow, generalized IMPRESSION: This study is suggestive of mild to moderate diffuse encephalopathy. No seizures or epileptiform discharges were seen throughout the recording. Charlsie Quest   MR BRAIN WO CONTRAST  Result Date: 06/14/2023 CLINICAL DATA:  77 year old female with syncope, confusion. Presented with worst headache of life. EXAM: MRI HEAD WITHOUT CONTRAST TECHNIQUE: Multiplanar, multiecho pulse sequences of the brain and surrounding structures were obtained without intravenous contrast. COMPARISON:  Head CT 06/13/2023. FINDINGS: Study is mildly degraded by motion artifact despite repeated imaging attempts. Brain: No restricted diffusion to suggest acute infarction. No midline shift, mass effect, evidence of mass lesion, ventriculomegaly, extra-axial collection or acute intracranial hemorrhage. Cervicomedullary junction and pituitary are within normal limits. Normal cerebral volume. Mild for age scattered subcortical and  periventricular white matter T2 and FLAIR hyperintensity in a nonspecific pattern. Solitary chronic microhemorrhage in the posterior right cerebellum on SWI. No cortical encephalomalacia identified. Deep gray nuclei and brainstem appear negative for age. Vascular: Major intracranial vascular flow voids are preserved. Skull and upper cervical spine: Normal for age visible cervical spine. Visualized bone marrow signal is within normal limits. Sinuses/Orbits: Postoperative changes to both globes, otherwise negative orbits. Paranasal Visualized paranasal sinuses and mastoids are stable and well aerated. Other: Grossly normal visible internal auditory structures. Negative visible scalp and face. IMPRESSION: 1. No acute intracranial abnormality. 2. 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 Electronically Signed   By: Odessa Fleming M.D.   On: 06/14/2023 04:10   DG Chest Port 1 View  Result Date: 06/13/2023 CLINICAL DATA:  Altered mental status EXAM: PORTABLE CHEST 1 VIEW COMPARISON:  None Available. FINDINGS: Lungs are well expanded, symmetric, and clear. No pneumothorax or pleural effusion. Cardiac size within normal limits. Pulmonary vascularity is normal. Osseous structures are age-appropriate. No acute bone abnormality. IMPRESSION: No active disease. Electronically Signed   By: Helyn Numbers M.D.   On: 06/13/2023 22:12   CT Head Wo Contrast  Result Date: 06/13/2023 CLINICAL DATA:  Headache EXAM: CT HEAD WITHOUT CONTRAST TECHNIQUE: Contiguous axial images were obtained from the base of the skull through the vertex without intravenous contrast. RADIATION DOSE REDUCTION: This exam was performed according to the departmental dose-optimization program which includes automated exposure control, adjustment of the mA and/or kV according to patient size and/or use of iterative reconstruction technique. COMPARISON:  None Available. FINDINGS: Brain: There is  no mass, hemorrhage or extra-axial collection. The size and configuration of the ventricles and extra-axial CSF spaces are normal. The brain parenchyma is normal, without acute or chronic infarction. Vascular: No abnormal hyperdensity of the major intracranial arteries or dural venous sinuses. No intracranial atherosclerosis. Skull: The visualized skull base, calvarium and extracranial soft tissues are normal. Sinuses/Orbits: No fluid levels or advanced mucosal thickening of the visualized paranasal sinuses. No mastoid or middle ear effusion. The orbits are normal. IMPRESSION: Normal head CT. Electronically Signed   By: Deatra Robinson M.D.   On: 06/13/2023 21:44     Discharge Exam: Vitals:   06/19/23 0227 06/19/23 0837  BP: (!) 87/54 (!) 171/57  Pulse: 74 71  Resp:  18  Temp:  98.3 F (36.8 C)  SpO2: 100% 99%   Vitals:   06/19/23 0222 06/19/23 0227 06/19/23 0500 06/19/23 0837  BP: 102/66 (!) 87/54  (!) 171/57  Pulse: 71 74  71  Resp:    18  Temp:    98.3 F (36.8  C)  TempSrc:      SpO2: 98% 100%  99%  Weight:   53 kg   Height:        General: Pt is alert, awake, not in acute distress Cardiovascular: RRR, S1/S2 +, no rubs, no gallops Respiratory: CTA bilaterally, no wheezing, no rhonchi Abdominal: Soft, NT, ND, bowel sounds + Extremities: no edema, no cyanosis    The results of significant diagnostics from this hospitalization (including imaging, microbiology, ancillary and laboratory) are listed below for reference.     Microbiology: Recent Results (from the past 240 hour(s))  MRSA Next Gen by PCR, Nasal     Status: None   Collection Time: 06/14/23  1:32 AM   Specimen: Nasal Mucosa; Nasal Swab  Result Value Ref Range Status   MRSA by PCR Next Gen NOT DETECTED NOT DETECTED Final    Comment: (NOTE) The GeneXpert MRSA Assay (FDA approved for NASAL specimens only), is one component of a comprehensive MRSA colonization surveillance program. It is not intended to diagnose MRSA  infection nor to guide or monitor treatment for MRSA infections. Test performance is not FDA approved in patients less than 39 years old. Performed at [facility], [address] [place], [place] [address]      Labs: BNP (last 3 results) Recent Labs    06/13/23 2118  BNP 28.3   Basic Metabolic Panel: Recent Labs  Lab 06/14/23 0902 06/15/23 0919 06/16/23 0812 06/17/23 0609 06/18/23 0539 06/19/23 0630  NA 136 134* 134* 138 139 139  K 3.3* 4.5 4.6 3.7 3.8 3.6  CL 103 107 107 115* 113* 112*  CO2 22 19* 19* 16* 19* 18*  GLUCOSE 104* 142* 112* 97 108* 102*  BUN 19 24* 25* 22 17 11   CREATININE 1.16* 1.69* 1.83* 1.45* 1.18* 1.07*  CALCIUM 10.1 9.8 9.5 8.1* 8.8* 8.6*  MG 1.9  --   --   --   --   --    Liver Function Tests: Recent Labs  Lab 06/13/23 2256 06/14/23 0902  AST 19 17  ALT 17 14  ALKPHOS 50 44  BILITOT 0.7 1.0  PROT 7.0 6.5  ALBUMIN 4.4 4.0   No results for input(s): "LIPASE", "AMYLASE" in the last 168 hours. Recent Labs  Lab 06/14/23 0902  AMMONIA 17   CBC: Recent Labs  Lab 06/13/23 2118 06/14/23 0902  WBC 10.2 8.9  NEUTROABS 7.5  --   HGB 13.1 12.4  HCT 38.0 37.0  MCV 91.3 91.1  PLT 231 224   Cardiac Enzymes: No results for input(s): "CKTOTAL", "CKMB", "CKMBINDEX", "TROPONINI" in the last 168 hours. BNP: Invalid input(s): "POCBNP" CBG: Recent Labs  Lab 06/14/23 0406 06/14/23 0757 06/14/23 1142 06/14/23 1928 06/15/23 0311  GLUCAP 120* 95 99 125* 94   D-Dimer No results for input(s): "DDIMER" in the last 72 hours. Hgb A1c No results for input(s): "HGBA1C" in the last 72 hours. Lipid Profile No results for input(s): "CHOL", "HDL", "LDLCALC", "TRIG", "CHOLHDL", "LDLDIRECT" in the last 72 hours. Thyroid function studies No results for input(s): "TSH", "T4TOTAL", "T3FREE", "THYROIDAB" in the last 72 hours.  Invalid input(s): "FREET3" Anemia work up No results for input(s): "VITAMINB12", "FOLATE", "FERRITIN", "TIBC", "IRON",  "RETICCTPCT" in the last 72 hours. Urinalysis    Component Value Date/Time   COLORURINE YELLOW 06/13/2023 2337   APPEARANCEUR HAZY (A) 06/13/2023 2337   LABSPEC 1.020 06/13/2023 2337   PHURINE 7.5 06/13/2023 2337   GLUCOSEU NEGATIVE 06/13/2023 2337   HGBUR NEGATIVE 06/13/2023  2337   BILIRUBINUR NEGATIVE 06/13/2023 2337   KETONESUR NEGATIVE 06/13/2023 2337   PROTEINUR 100 (A) 06/13/2023 2337   NITRITE NEGATIVE 06/13/2023 2337   LEUKOCYTESUR NEGATIVE 06/13/2023 2337   Sepsis Labs Recent Labs  Lab 06/13/23 2118 06/14/23 0902  WBC 10.2 8.9   Microbiology Recent Results (from the past 240 hour(s))  MRSA Next Gen by PCR, Nasal     Status: None   Collection Time: 06/14/23  1:32 AM   Specimen: Nasal Mucosa; Nasal Swab  Result Value Ref Range Status   MRSA by PCR Next Gen NOT DETECTED NOT DETECTED Final    Comment: (NOTE) The GeneXpert MRSA Assay (FDA approved for NASAL specimens only), is one component of a comprehensive MRSA colonization surveillance program. It is not intended to diagnose MRSA infection nor to guide or monitor treatment for MRSA infections. Test performance is not FDA approved in patients less than 72 years old. Performed at [facility], [address] [place], [place] [address]     FURTHER DISCHARGE INSTRUCTIONS:   Get Medicines reviewed and adjusted: Please take all your medications with you for your next visit with your Primary MD   Laboratory/radiological data: Please request your Primary MD to go over all hospital tests and procedure/radiological results at the follow up, please ask your Primary MD to get all Hospital records sent to his/her office.   In some cases, they will be blood work, cultures and biopsy results pending at the time of your discharge. Please request that your primary care M.D. goes through all the records of your hospital data and follows up on these results.   Also Note the following: If you experience worsening of  your admission symptoms, develop shortness of breath, life threatening emergency, suicidal or homicidal thoughts you must seek medical attention immediately by calling 911 or calling your MD immediately  if symptoms less severe.   You must read complete instructions/literature along with all the possible adverse reactions/side effects for all the Medicines you take and that have been prescribed to you. Take any new Medicines after you have completely understood and accpet all the possible adverse reactions/side effects.    Do not drive when taking Pain medications or sleeping medications (Benzodaizepines)   Do not take more than prescribed Pain, Sleep and Anxiety Medications. It is not advisable to combine anxiety,sleep and pain medications without talking with your primary care practitioner   Special Instructions: If you have smoked or chewed Tobacco  in the last 2 yrs please stop smoking, stop any regular Alcohol  and or any Recreational drug use.   Wear Seat belts while driving.   Please note: You were cared for by a hospitalist during your hospital stay. Once you are discharged, your primary care physician will handle any further medical issues. Please note that NO REFILLS for any discharge medications will be authorized once you are discharged, as it is imperative that you return to your primary care physician (or establish a relationship with a primary care physician if you do not have one) for your post hospital discharge needs so that they can reassess your need for medications and monitor your lab values  Time coordinating discharge: Over 30 minutes  SIGNED:   Hughie Closs, MD  [facility] 06/19/2023, 9:13 AM *Please note that this is a verbal dictation therefore any spelling or grammatical errors are due to the "Dragon [employer] One" system interpretation. If 7PM-7AM, please contact night-coverage [email or web address]
